# Patient Record
Sex: Male | Born: 1970 | Race: White | Hispanic: No | Marital: Married | Smoking: Former smoker
Health system: Southern US, Community
[De-identification: ages and names within clinical notes are randomized; demographics above are authoritative.]

## PROBLEM LIST (undated history)

## (undated) DIAGNOSIS — E785 Hyperlipidemia, unspecified: Secondary | ICD-10-CM

## (undated) DIAGNOSIS — I10 Essential (primary) hypertension: Secondary | ICD-10-CM

## (undated) DIAGNOSIS — T7840XA Allergy, unspecified, initial encounter: Secondary | ICD-10-CM

## (undated) HISTORY — DX: Allergy, unspecified, initial encounter: T78.40XA

## (undated) HISTORY — DX: Hyperlipidemia, unspecified: E78.5

## (undated) HISTORY — PX: CHOLECYSTECTOMY: SHX55

## (undated) HISTORY — PX: GALLBLADDER SURGERY: SHX652

## (undated) HISTORY — DX: Essential (primary) hypertension: I10

---

## 2010-01-23 ENCOUNTER — Encounter: Payer: Self-pay | Admitting: Internal Medicine

## 2010-02-18 ENCOUNTER — Ambulatory Visit: Payer: Self-pay | Admitting: Internal Medicine

## 2010-02-18 DIAGNOSIS — E785 Hyperlipidemia, unspecified: Secondary | ICD-10-CM

## 2010-03-05 ENCOUNTER — Ambulatory Visit: Payer: Self-pay | Admitting: Internal Medicine

## 2010-03-05 DIAGNOSIS — R079 Chest pain, unspecified: Secondary | ICD-10-CM

## 2010-05-21 ENCOUNTER — Ambulatory Visit: Payer: Self-pay | Admitting: Internal Medicine

## 2010-05-21 LAB — CONVERTED CEMR LAB
AST: 22 units/L (ref 0–37)
Cholesterol: 163 mg/dL (ref 0–200)

## 2010-08-24 ENCOUNTER — Ambulatory Visit: Payer: Self-pay | Admitting: Internal Medicine

## 2010-11-09 ENCOUNTER — Ambulatory Visit: Payer: Self-pay | Admitting: Internal Medicine

## 2010-11-17 LAB — CONVERTED CEMR LAB
ALT: 37 units/L (ref 0–53)
BUN: 26 mg/dL — ABNORMAL HIGH (ref 6–23)
Basophils Absolute: 0 10*3/uL (ref 0.0–0.1)
Bilirubin Urine: NEGATIVE
Bilirubin, Direct: 0.1 mg/dL (ref 0.0–0.3)
Blood in Urine, dipstick: NEGATIVE
Cholesterol: 218 mg/dL — ABNORMAL HIGH (ref 0–200)
Creatinine, Ser: 0.9 mg/dL (ref 0.4–1.5)
Direct LDL: 152.6 mg/dL
Eosinophils Relative: 1.7 % (ref 0.0–5.0)
GFR calc non Af Amer: 95.21 mL/min (ref >60.00)
Glucose, Bld: 91 mg/dL (ref 70–99)
Glucose, Urine, Semiquant: NEGATIVE
HDL: 41.9 mg/dL (ref >39.00)
Ketones, urine, test strip: NEGATIVE
Lymphs Abs: 1.7 10*3/uL (ref 0.7–4.0)
MCV: 93.1 fL (ref 78.0–100.0)
Monocytes Absolute: 0.6 10*3/uL (ref 0.1–1.0)
Monocytes Relative: 8 % (ref 3.0–12.0)
Neutrophils Relative %: 67.9 % (ref 43.0–77.0)
Platelets: 241 10*3/uL (ref 150.0–400.0)
Protein, U semiquant: NEGATIVE
RDW: 13 % (ref 11.5–14.6)
Specific Gravity, Urine: 1.02
TSH: 1.62 microintl units/mL (ref 0.35–5.50)
Total Bilirubin: 1 mg/dL (ref 0.3–1.2)
VLDL: 36.8 mg/dL (ref 0.0–40.0)
WBC: 7.7 10*3/uL (ref 4.5–10.5)
pH: 5

## 2010-12-09 ENCOUNTER — Ambulatory Visit
Admission: RE | Admit: 2010-12-09 | Discharge: 2010-12-09 | Payer: Self-pay | Source: Home / Self Care | Attending: Internal Medicine | Admitting: Internal Medicine

## 2010-12-21 NOTE — Assessment & Plan Note (Signed)
Summary: 3 month follow/pt fasting/cjr   Vital Signs:  Patient profile:   40 year old male Weight:      209 pounds Temp:     97.6 degrees F oral BP sitting:   120 / 70  (right arm) Cuff size:   regular  Vitals Entered By: Duard Brady LPN (May 21, 1609 8:00 AM) CC: 3 mos rov - doing well Is Patient Diabetic? No   CC:  3 mos rov - doing well.  History of Present Illness: 40 year old patient who is seen today for follow-up.  He has a history of dyslipidemia and was placed on Lipitor 40 mg daily 3 months ago.  He has tolerated this medication well without muscle pain or weakness.   no concerns or complaints  Preventive Screening-Counseling & Management  Alcohol-Tobacco     Smoking Status: never  Allergies (verified): No Known Drug Allergies  Past History:  Past Medical History: Reviewed history from 02/18/2010 and no changes required. Hyperlipidemia overweight   Review of Systems  The patient denies anorexia, fever, weight loss, weight gain, vision loss, decreased hearing, hoarseness, chest pain, syncope, dyspnea on exertion, peripheral edema, prolonged cough, headaches, hemoptysis, abdominal pain, melena, hematochezia, severe indigestion/heartburn, hematuria, incontinence, genital sores, muscle weakness, suspicious skin lesions, transient blindness, difficulty walking, depression, unusual weight change, abnormal bleeding, enlarged lymph nodes, angioedema, breast masses, and testicular masses.    Physical Exam  General:  overweight-appearing.  overweight-appearing.   Eyes:  No corneal or conjunctival inflammation noted. EOMI. Perrla. Funduscopic exam benign, without hemorrhages, exudates or papilledema. Vision grossly normal. Mouth:  Oral mucosa and oropharynx without lesions or exudates.  Teeth in good repair. Neck:  No deformities, masses, or tenderness noted. Lungs:  Normal respiratory effort, chest expands symmetrically. Lungs are clear to auscultation, no  crackles or wheezes. Heart:  Normal rate and regular rhythm. S1 and S2 normal without gallop, murmur, click, rub or other extra sounds. Abdomen:  Bowel sounds positive,abdomen soft and non-tender without masses, organomegaly or hernias noted.   Impression & Recommendations:  Problem # 1:  HYPERLIPIDEMIA (ICD-272.4)  His updated medication list for this problem includes:    Lipitor 40 Mg Tabs (Atorvastatin calcium) ..... One daily  Orders: Venipuncture (96045) TLB-AST (SGOT) (84450-SGOT) TLB-Lipid Panel (80061-LIPID)  His updated medication list for this problem includes:    Lipitor 40 Mg Tabs (Atorvastatin calcium) ..... One daily  Complete Medication List: 1)  Lipitor 40 Mg Tabs (Atorvastatin calcium) .... One daily  Patient Instructions: 1)  Limit your Sodium (Salt). 2)  It is important that you exercise regularly at least 20 minutes 5 times a week. If you develop chest pain, have severe difficulty breathing, or feel very tired , stop exercising immediately and seek medical attention. 3)  You need to lose weight. Consider a lower calorie diet and regular exercise.  4)  SGOT in 3 months and 6 months  and CPX in 12 months Prescriptions: LIPITOR 40 MG TABS (ATORVASTATIN CALCIUM) one daily  #90 x 6   Entered and Authorized by:   Gordy Savers  MD   Signed by:   Gordy Savers  MD on 05/21/2010   Method used:   Electronically to        [facility] # [id number]* (retail)       [address]       [place], [place]  [address]       Ph: [phone]       Fax: [phone]  RxID:   [id number]

## 2010-12-21 NOTE — Assessment & Plan Note (Signed)
Summary: joint and muscle pain/upper back/underneath rt arm and should...   Vital Signs:  Patient profile:   40 year old male Weight:      207 pounds Temp:     97.9 degrees F oral BP sitting:   122 / 84  (left arm) Cuff size:   regular  Vitals Entered By: Duard Brady LPN (March 05, 2010 4:23 PM) CC: c/o (R) flank tenderness and pain - possible injuried playing golf Is Patient Diabetic? No   CC:  c/o (R) flank tenderness and pain - possible injuried playing golf.  History of Present Illness: 40 -year-old patient who is seen today with a 3 week history of right chest wall pain.  This is aggravated by certain movements, coughing, sneezing, and aggravated by playing golf.  Denies any shortness of breath, cough, fever, or other bony complaints.  He has had no prior episodes.  He is on Lipitor for dyslipidemia, otherwise takes no chronic medications  Allergies (verified): No Known Drug Allergies  Physical Exam  General:  Well-developed,well-nourished,in no acute distress; alert,appropriate and cooperative throughout examination Chest Wall:  no tenderness along the right lateral or posterior chest wall Lungs:  chest was clear with normal aeration bilaterally.  No rub noted Heart:  normal rhythm, without tachycardia   Impression & Recommendations:  Problem # 1:  CHEST PAIN (ICD-786.50) musculoligamentous chest wall pain  Problem # 2:  HYPERLIPIDEMIA (ICD-272.4)  His updated medication list for this problem includes:    Lipitor 40 Mg Tabs (Atorvastatin calcium) ..... One daily  Complete Medication List: 1)  Lipitor 40 Mg Tabs (Atorvastatin calcium) .... One daily  Patient Instructions: 1)  Take 400-600mg  of Ibuprofen (Advil, Motrin) with food every 4-6 hours as needed for relief of pain or comfort of fever. 2)  Please schedule a follow-up appointment as needed.

## 2010-12-21 NOTE — Assessment & Plan Note (Signed)
Summary: TO BE EST/NJR   Vital Signs:  Patient profile:   40 year old male Height:      69.5 inches Weight:      210 pounds BMI:     30.68 Temp:     97.9 degrees F oral BP sitting:   118 / 70  (right arm) Cuff size:   regular  Vitals Entered By: Duard Brady LPN (February 18, 2010 2:47 PM) CC: new tp to establis - moved to area - ins lab work reflects elevated cholestorol Is Patient Diabetic? No   CC:  new tp to establis - moved to area - ins lab work reflects elevated cholestorol.  History of Present Illness: 40 year old gentleman, who is seen today to establish with our practice.  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  Concerns today  include history of dyslipidemia.  A recent lipid profile revealed a total cholesterol of 280 and an HDL cholesterol of 33 triglycerides are elevated at 600, but apparent that this was a nonfasting sample.  He denies any cardiopulmonary complaints  Preventive Screening-Counseling & Management  Alcohol-Tobacco     Smoking Status: never  Caffeine-Diet-Exercise     Does Patient Exercise: yes  Allergies (verified): No Known Drug Allergies  Past History:  Past Medical History: Hyperlipidemia overweight   Family History: Reviewed history and no changes required. father died age 39, history of coronary artery disease, status post CABG at 40; complications of lung cancer.  Also history of prostate cancer in his early 109s mother at 2 of breast cancer survivor.  History of hypertension one brother with a hypercholesterolemia  Social History: Reviewed history and no changes required. 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[employer]  [employer] 3 young children and marriedSmoking Status:  never Does Patient Exercise:  yes  Review of Systems  The patient denies anorexia, fever, weight loss, weight gain, vision loss, decreased hearing, hoarseness,  chest pain, syncope, dyspnea on exertion, peripheral edema, prolonged cough, headaches, hemoptysis, abdominal pain, melena, hematochezia, severe indigestion/heartburn, hematuria, incontinence, genital sores, muscle weakness, suspicious skin lesions, transient blindness, difficulty walking, depression, unusual weight change, abnormal bleeding, enlarged lymph nodes, angioedema, breast masses, and testicular masses.    Physical Exam  General:  overweight-appearing.  low-pressure low normal Head:  Normocephalic and atraumatic without obvious abnormalities. No apparent alopecia or balding. Eyes:  No corneal or conjunctival inflammation noted. EOMI. Perrla. Funduscopic exam benign, without hemorrhages, exudates or papilledema. Vision grossly normal. Ears:  External ear exam shows no significant lesions or deformities.  Otoscopic examination reveals clear canals, tympanic membranes are intact bilaterally without bulging, retraction, inflammation or discharge. Hearing is grossly normal bilaterally. Nose:  External nasal examination shows no deformity or inflammation. Nasal mucosa are pink and moist without lesions or exudates. Mouth:  Oral mucosa and oropharynx without lesions or exudates.  Teeth in good repair. Neck:  No deformities, masses, or tenderness noted. Chest Wall:  No deformities, masses, tenderness or gynecomastia noted. Breasts:  No masses or gynecomastia noted Lungs:  Normal respiratory effort, chest expands symmetrically. Lungs are clear to auscultation, no crackles or wheezes. Heart:  Normal rate and regular rhythm. S1 and S2 normal without gallop, murmur, click, rub or other extra sounds. Abdomen:  Bowel sounds positive,abdomen soft and non-tender without masses, organomegaly or hernias noted. Genitalia:  Testes bilaterally descended without nodularity, tenderness or masses. No scrotal masses or lesions. No penis lesions or urethral discharge. Msk:  No deformity or scoliosis noted of  thoracic or lumbar spine.   Pulses:  R and L carotid,radial,femoral,dorsalis pedis and posterior tibial pulses are full and equal bilaterally Extremities:  No clubbing, cyanosis, edema, or deformity noted with normal full range of motion of all joints.   Neurologic:  alert & oriented X3, cranial nerves II-XII intact, strength normal in all extremities, and gait normal.   Skin:  Intact without suspicious lesions or rashes Cervical Nodes:  No lymphadenopathy noted Axillary Nodes:  No palpable lymphadenopathy Inguinal Nodes:  No significant adenopathy Psych:  Cognition and judgment appear intact. Alert and cooperative with normal attention span and concentration. No apparent delusions, illusions, hallucinations   Impression & Recommendations:  Problem # 1:  Preventive Health Care (ICD-V70.0)  Problem # 2:  HYPERLIPIDEMIA (ICD-272.4)  His updated medication list for this problem includes:    Lipitor 40 Mg Tabs (Atorvastatin calcium) ..... One daily  Complete Medication List: 1)  No Current Rx Meds  2)  Lipitor 40 Mg Tabs (Atorvastatin calcium) .... One daily  Patient Instructions: 1)  Please schedule a follow-up appointment in 3 months. 2)  Advised not to eat any food or drink any liquids after 10 PM the night before your procedure. 3)  It is important that you exercise regularly at least 20 minutes 5 times a week. If you develop chest pain, have severe difficulty breathing, or feel very tired , stop exercising immediately and seek medical attention. 4)  You need to lose weight. Consider a lower calorie diet and regular exercise.  Prescriptions: LIPITOR 40 MG TABS (ATORVASTATIN CALCIUM) one daily  #90 x 6   Entered and Authorized by:   Gordy Savers  MD   Signed by:   Gordy Savers  MD on 02/18/2010   Method used:   Print then Give to Patient   RxID:   [id number]

## 2010-12-23 NOTE — Assessment & Plan Note (Signed)
Summary: CPX/CJR/PT [facility] FROM BMP/CJR   Vital Signs:  Patient profile:   40 year old male Height:      69 inches Weight:      215 pounds BMI:     31.86 Temp:     97.5 degrees F oral BP sitting:   120 / 72  (right arm) Cuff size:   regular  Vitals Entered By: Duard Brady LPN (December 09, 2010 2:49 PM) CC:  will be Is Patient Diabetic? No   CC:   will be.  History of Present Illness: 41  year old patient who is seen today for a health-maintenance examination.  He has a history of dyslipidemia, and otherwise enjoys excellent health.  No concerns or complaints today.  Unfortunately, he is not completely compliant with his atorvastatin.  lipid  profile reviewed  Preventive Screening-Counseling & Management  Alcohol-Tobacco     Smoking Status: never  Allergies (verified): No Known Drug Allergies  Past History:  Past Medical History: Reviewed history from 02/18/2010 and no changes required. Hyperlipidemia overweight   Past Surgical History: Cholecystectomy 2004- laparoscopic  Family History: Reviewed history from 02/18/2010 and no changes required. father died age 18, history of coronary artery disease, status post CABG at 32; complications of lung cancer.  Also history of prostate cancer in his early 63s mother at 57 of breast cancer survivor.  History of hypertension one brother with a hypercholesterolemia  Social History: Reviewed history from 02/18/2010 and no changes required. 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    Physical Exam  General:  overweight-appearing.  normal blood pressure  Head:  Normocephalic and atraumatic without obvious abnormalities. No apparent alopecia or balding. Eyes:  No corneal or conjunctival inflammation noted. EOMI. Perrla. Funduscopic exam benign, without hemorrhages, exudates or papilledema. Vision grossly normal. Ears:  External ear exam shows no significant lesions or deformities.  Otoscopic examination reveals clear canals, tympanic membranes are intact bilaterally without bulging, retraction, inflammation or discharge. Hearing is grossly normal bilaterally. Nose:  External nasal examination shows no deformity or inflammation. Nasal mucosa are pink and moist without lesions or exudates. Mouth:  Oral mucosa and oropharynx without lesions or exudates.  Teeth in good repair. Neck:  No deformities, masses, or tenderness noted. Chest Wall:  No deformities, masses, tenderness or gynecomastia noted. Breasts:  No masses or gynecomastia noted Lungs:  Normal respiratory effort, chest expands symmetrically. Lungs are clear to auscultation, no crackles or wheezes. Heart:  Normal rate and regular rhythm. S1 and S2 normal without gallop, murmur, click, rub or other extra sounds. Abdomen:  Bowel sounds positive,abdomen soft and non-tender without masses, organomegaly or hernias noted. Rectal:  No external abnormalities noted. Normal sphincter tone. No rectal masses or tenderness. Genitalia:  Testes bilaterally descended without nodularity, tenderness or masses. No scrotal masses or lesions. No penis lesions or urethral discharge. Prostate:  Prostate gland firm and smooth, no enlargement, nodularity, tenderness, mass, asymmetry or induration. Msk:  No deformity or scoliosis noted of  thoracic or lumbar spine.  Pulses:  R and L carotid,radial,femoral,dorsalis pedis and posterior tibial pulses are full and equal bilaterally Extremities:  No clubbing, cyanosis, edema, or deformity noted with normal full range of motion of all joints.   Neurologic:  No cranial nerve deficits noted. Station and gait are normal. Plantar reflexes are down-going bilaterally. DTRs are symmetrical throughout. Sensory, motor and coordinative functions appear intact. Skin:  Intact without suspicious lesions or rashes Cervical Nodes:  No lymphadenopathy noted Axillary Nodes:  No palpable lymphadenopathy Inguinal Nodes:  No significant adenopathy Psych:  Cognition and judgment appear intact. Alert and cooperative with normal attention span and concentration. No apparent delusions, illusions, hallucinations   Impression & Recommendations:  Problem # 1:  Preventive Health Care (ICD-V70.0)  Complete Medication List: 1)  Lipitor 40 Mg Tabs (Atorvastatin calcium) .... One daily  Patient Instructions: 1)  Please schedule a follow-up appointment in 1 year. 2)  Limit your Sodium (Salt). 3)  It is important that you exercise regularly at least 20 minutes 5 times a week. If you develop chest pain, have severe difficulty breathing, or feel very tired , stop exercising immediately and seek medical attention. 4)  You need to lose weight. Consider a lower calorie diet and regular exercise.  Prescriptions: LIPITOR 40 MG TABS (ATORVASTATIN CALCIUM) one daily  #90 x 6   Entered and Authorized by:   Gordy Savers  MD   Signed by:   Gordy Savers  MD on 12/09/2010   Method used:   Electronically to        [facility] # [phone]* (retail)       [address]       [place], [place]  [address]       Ph: [phone]       Fax: [phone]   RxID:   [id number]    Orders Added: 1)  Est. Patient 40-64 years [72536]   Immunization History:  Influenza Immunization History:    Influenza:   Historical (10/05/2010)   Immunization History:  Influenza Immunization History:    Influenza:  Historical (10/05/2010)

## 2011-01-17 ENCOUNTER — Encounter: Payer: Self-pay | Admitting: Internal Medicine

## 2011-01-17 ENCOUNTER — Ambulatory Visit (INDEPENDENT_AMBULATORY_CARE_PROVIDER_SITE_OTHER): Payer: 59 | Admitting: Internal Medicine

## 2011-01-17 VITALS — BP 122/78 | Temp 98.0°F | Wt 212.0 lb

## 2011-01-17 DIAGNOSIS — J019 Acute sinusitis, unspecified: Secondary | ICD-10-CM

## 2011-01-17 DIAGNOSIS — E785 Hyperlipidemia, unspecified: Secondary | ICD-10-CM

## 2011-01-17 MED ORDER — DOXYCYCLINE HYCLATE 100 MG PO TABS: 100.0000 mg | ORAL_TABLET | Freq: Two times a day (BID) | ORAL | Status: AC

## 2011-01-17 MED ORDER — DOXYCYCLINE HYCLATE 100 MG PO TABS: 100.0000 mg | ORAL_TABLET | Freq: Two times a day (BID) | ORAL | Status: DC

## 2011-01-17 NOTE — Patient Instructions (Signed)
Get plenty of rest, Drink lots of  clear liquids, and use Tylenol or ibuprofen for fever and discomfort.    Take your antibiotic as prescribed until ALL of it is gone, but stop if you develop a rash, swelling, or any side effects of the medication.  Contact our office as soon as possible if  there are side effects of the medication.  Call or return to clinic prn if these symptoms worsen or fail to improve as anticipated.   

## 2011-01-17 NOTE — Progress Notes (Signed)
  Subjective:    Patient ID: Charles Rowe, male    DOB: 08-Oct-1971, 40 y.o.   MRN: [id number]  HPI  40 year old patient who presents with a three-week history of sinus congestion. He initially had a URI and symptoms improved but for the past week has had worsening sinus pain pressure and worsening purulent sinus drainage. He describes mostly pain in the frontal sinus area there's been no documented fever but he has felt fatigued and unwell   Review of Systems  Constitutional: Negative for fever, chills, appetite change and fatigue.  HENT: Positive for congestion and rhinorrhea. Negative for hearing loss, ear pain, sore throat, trouble swallowing, neck stiffness, dental problem, voice change and tinnitus.   Eyes: Negative for pain, discharge and visual disturbance.  Respiratory: Negative for cough, chest tightness, wheezing and stridor.   Cardiovascular: Negative for chest pain, palpitations and leg swelling.  Gastrointestinal: Negative for nausea, vomiting, abdominal pain, diarrhea, constipation, blood in stool and abdominal distention.  Genitourinary: Negative for urgency, hematuria, flank pain, discharge, difficulty urinating and genital sores.  Musculoskeletal: Negative for myalgias, back pain, joint swelling, arthralgias and gait problem.  Skin: Negative for rash.  Neurological: Negative for dizziness, syncope, speech difficulty, weakness, numbness and headaches.  Hematological: Negative for adenopathy. Does not bruise/bleed easily.  Psychiatric/Behavioral: Negative for behavioral problems and dysphoric mood. The patient is not nervous/anxious.        Objective:   Physical Exam  Constitutional: He is oriented to person, place, and time. He appears well-developed and well-nourished.  HENT:  Head: Normocephalic.  Right Ear: External ear normal.  Left Ear: External ear normal.  Eyes: Conjunctivae and EOM are normal.  Neck: Normal range of motion.  Cardiovascular: Normal rate and  normal heart sounds.   Pulmonary/Chest: Breath sounds normal.  Abdominal: Bowel sounds are normal.  Musculoskeletal: Normal range of motion. He exhibits no edema and no tenderness.  Neurological: He is alert and oriented to person, place, and time.  Psychiatric: He has a normal mood and affect. His behavior is normal.          Assessment & Plan:   acute sinusitis-   We'll continue expectorants and add nasal saline irrigation. We'll treat with 10 days of doxycycline

## 2011-06-01 ENCOUNTER — Encounter: Payer: Self-pay | Admitting: Family Medicine

## 2011-06-01 ENCOUNTER — Ambulatory Visit (INDEPENDENT_AMBULATORY_CARE_PROVIDER_SITE_OTHER): Payer: 59 | Admitting: Family Medicine

## 2011-06-01 DIAGNOSIS — R319 Hematuria, unspecified: Secondary | ICD-10-CM

## 2011-06-01 DIAGNOSIS — M549 Dorsalgia, unspecified: Secondary | ICD-10-CM

## 2011-06-01 LAB — POCT URINALYSIS DIPSTICK
Bilirubin, UA: NEGATIVE
Glucose, UA: NEGATIVE
Leukocytes, UA: NEGATIVE
Nitrite, UA: NEGATIVE
Urobilinogen, UA: 0.2

## 2011-06-01 NOTE — Progress Notes (Signed)
  Subjective:    Patient ID: Charles Rowe, male    DOB: October 29, 1971, 40 y.o.   MRN: [id number]  HPI Patient seen with initial question of bladder infection. Yesterday had dull achy pain left lower lumbar. Some radiation to groin. Remote history of kidney stone about 10 years ago. This pain somewhat different. Mild to moderate severity. Pain resolved today. Chills last night but no documented fever. Yesterday had some burning with urination frequency but none today. No gross hematuria. No abdominal pain. No aspirin or anticoagulant use. Nonsmoker   Review of Systems  Constitutional: Negative for fever and chills.  Respiratory: Negative for cough and shortness of breath.   Cardiovascular: Negative for chest pain, palpitations and leg swelling.  Gastrointestinal: Negative for abdominal pain and abdominal distention.  Musculoskeletal: Positive for back pain.       Objective:   Physical Exam  Constitutional: He appears well-developed and well-nourished.  Cardiovascular: Normal rate, regular rhythm and normal heart sounds.   Pulmonary/Chest: Effort normal and breath sounds normal. No respiratory distress. He has no wheezes. He has no rales.  Abdominal: Soft. He exhibits no mass. There is no tenderness. There is no rebound and no guarding.  Musculoskeletal:       No flank tenderness          Assessment & Plan:  #1 low back pain resolved today. Questionable etiology. Clinically this does not sound much like kidney stone #2 hematuria on urine dipstick. No leukocytes or other suggestion of infection. Recommend followup UA in 2 weeks and if hematuria persists that time urology referral

## 2011-06-16 ENCOUNTER — Other Ambulatory Visit: Payer: 59

## 2011-06-16 DIAGNOSIS — R319 Hematuria, unspecified: Secondary | ICD-10-CM

## 2011-06-16 LAB — POCT URINALYSIS DIPSTICK
Blood, UA: NEGATIVE
Nitrite, UA: NEGATIVE
Protein, UA: NEGATIVE
Spec Grav, UA: 1.02
Urobilinogen, UA: 0.2

## 2011-12-11 ENCOUNTER — Other Ambulatory Visit: Payer: Self-pay | Admitting: Internal Medicine

## 2012-10-10 ENCOUNTER — Encounter: Payer: Self-pay | Admitting: Internal Medicine

## 2012-10-10 ENCOUNTER — Ambulatory Visit (INDEPENDENT_AMBULATORY_CARE_PROVIDER_SITE_OTHER): Payer: 59 | Admitting: Internal Medicine

## 2012-10-10 VITALS — BP 140/90 | HR 80 | Temp 97.8°F | Resp 18 | Wt 211.0 lb

## 2012-10-10 DIAGNOSIS — Z0000 Encounter for general adult medical examination without abnormal findings: Secondary | ICD-10-CM

## 2012-10-10 DIAGNOSIS — E785 Hyperlipidemia, unspecified: Secondary | ICD-10-CM

## 2012-10-10 LAB — COMPREHENSIVE METABOLIC PANEL
CO2: 29 mEq/L (ref 19–32)
Creatinine, Ser: 0.9 mg/dL (ref 0.4–1.5)
GFR: 96.74 mL/min (ref >60.00)
Glucose, Bld: 108 mg/dL — ABNORMAL HIGH (ref 70–99)
Total Bilirubin: 0.7 mg/dL (ref 0.3–1.2)

## 2012-10-10 LAB — CBC WITH DIFFERENTIAL/PLATELET
Basophils Relative: 0.5 % (ref 0.0–3.0)
Eosinophils Relative: 1.3 % (ref 0.0–5.0)
HCT: 45.5 % (ref 39.0–52.0)
Hemoglobin: 15.3 g/dL (ref 13.0–17.0)
Lymphs Abs: 1.5 10*3/uL (ref 0.7–4.0)
Monocytes Relative: 7.1 % (ref 3.0–12.0)
Neutro Abs: 5.9 10*3/uL (ref 1.4–7.7)
WBC: 8.1 10*3/uL (ref 4.5–10.5)

## 2012-10-10 LAB — TSH: TSH: 1.38 u[IU]/mL (ref 0.35–5.50)

## 2012-10-10 MED ORDER — ATORVASTATIN CALCIUM 40 MG PO TABS: 40.0000 mg | ORAL_TABLET | Freq: Every day | ORAL | Status: DC

## 2012-10-10 NOTE — Progress Notes (Signed)
  Subjective:    Patient ID: Charles Rowe, male    DOB: 12/05/1970, 41 y.o.   MRN: [id number]  HPI  41 year old patient who has a history of dyslipidemia. He is doing quite well and is seen for his annual evaluation. He has no concerns or complaints. Has tolerated medication well but has been out for a few weeks.  No past medical history on file.  History   Social History  . Marital Status: Married    Spouse Name: N/A    Number of Children: N/A  . Years of Education: N/A   Occupational History  . Not on file.   Social History Main Topics  . Smoking status: Current Some Day Smoker    Types: Cigarettes, Cigars  . Smokeless tobacco: Former Neurosurgeon     Comment: 10 cigarettes a week  . Alcohol Use: Not on file  . Drug Use: Not on file  . Sexually Active: Not on file   Other Topics Concern  . Not on file   Social History Narrative  . No narrative on file    No past surgical history on file.  No family history on file.  No Known Allergies  Current Outpatient Prescriptions on File Prior to Visit  Medication Sig Dispense Refill  . [DISCONTINUED] atorvastatin (LIPITOR) 40 MG tablet TAKE 1 TABLET BY MOUTH EVERY DAY  90 tablet  1    BP 140/90  Pulse 80  Temp 97.8 F (36.6 C) (Oral)  Resp 18  Wt 211 lb (95.709 kg)     Review of Systems  Constitutional: Negative for fever, chills, appetite change and fatigue.  HENT: Negative for hearing loss, ear pain, congestion, sore throat, trouble swallowing, neck stiffness, dental problem, voice change and tinnitus.   Eyes: Negative for pain, discharge and visual disturbance.  Respiratory: Negative for cough, chest tightness, wheezing and stridor.   Cardiovascular: Negative for chest pain, palpitations and leg swelling.  Gastrointestinal: Negative for nausea, vomiting, abdominal pain, diarrhea, constipation, blood in stool and abdominal distention.  Genitourinary: Negative for urgency, hematuria, flank pain, discharge, difficulty  urinating and genital sores.  Musculoskeletal: Negative for myalgias, back pain, joint swelling, arthralgias and gait problem.  Skin: Negative for rash.  Neurological: Negative for dizziness, syncope, speech difficulty, weakness, numbness and headaches.  Hematological: Negative for adenopathy. Does not bruise/bleed easily.  Psychiatric/Behavioral: Negative for behavioral problems and dysphoric mood. The patient is not nervous/anxious.        Objective:   Physical Exam  Constitutional: He is oriented to person, place, and time. He appears well-developed.  HENT:  Head: Normocephalic.  Right Ear: External ear normal.  Left Ear: External ear normal.  Eyes: Conjunctivae normal and EOM are normal.  Neck: Normal range of motion.  Cardiovascular: Normal rate and normal heart sounds.   Pulmonary/Chest: Breath sounds normal.  Abdominal: Bowel sounds are normal.  Musculoskeletal: Normal range of motion. He exhibits no edema and no tenderness.  Neurological: He is alert and oriented to person, place, and time.  Psychiatric: He has a normal mood and affect. His behavior is normal.          Assessment & Plan:   HLD-   Lipitor RF CPX 1 year

## 2012-10-10 NOTE — Patient Instructions (Signed)
Limit your sodium (Salt) intake    It is important that you exercise regularly, at least 20 minutes 3 to 4 times per week.  If you develop chest pain or shortness of breath seek  medical attention.  Return in one year for follow-up   

## 2013-10-03 ENCOUNTER — Other Ambulatory Visit (INDEPENDENT_AMBULATORY_CARE_PROVIDER_SITE_OTHER): Payer: 59

## 2013-10-03 DIAGNOSIS — Z0000 Encounter for general adult medical examination without abnormal findings: Secondary | ICD-10-CM

## 2013-10-03 LAB — LIPID PANEL
Cholesterol: 206 mg/dL — ABNORMAL HIGH (ref 0–200)
HDL: 37.7 mg/dL — ABNORMAL LOW (ref >39.00)
Total CHOL/HDL Ratio: 5

## 2013-10-03 LAB — POCT URINALYSIS DIPSTICK
Leukocytes, UA: NEGATIVE
Nitrite, UA: NEGATIVE
Protein, UA: NEGATIVE
Urobilinogen, UA: 0.2

## 2013-10-03 LAB — BASIC METABOLIC PANEL
CO2: 28 mEq/L (ref 19–32)
Calcium: 9.2 mg/dL (ref 8.4–10.5)
Creatinine, Ser: 1 mg/dL (ref 0.4–1.5)
Glucose, Bld: 109 mg/dL — ABNORMAL HIGH (ref 70–99)
Sodium: 139 mEq/L (ref 135–145)

## 2013-10-03 LAB — CBC WITH DIFFERENTIAL/PLATELET
Basophils Absolute: 0 10*3/uL (ref 0.0–0.1)
Eosinophils Absolute: 0.1 10*3/uL (ref 0.0–0.7)
Hemoglobin: 15.2 g/dL (ref 13.0–17.0)
Lymphocytes Relative: 21.3 % (ref 12.0–46.0)
MCHC: 34.5 g/dL (ref 30.0–36.0)
Neutro Abs: 5 10*3/uL (ref 1.4–7.7)
RDW: 12.9 % (ref 11.5–14.6)

## 2013-10-03 LAB — HEPATIC FUNCTION PANEL
Albumin: 4.3 g/dL (ref 3.5–5.2)
Alkaline Phosphatase: 48 U/L (ref 39–117)

## 2013-10-03 LAB — TSH: TSH: 0.29 u[IU]/mL — ABNORMAL LOW (ref 0.35–5.50)

## 2013-10-10 ENCOUNTER — Other Ambulatory Visit: Payer: Self-pay | Admitting: Internal Medicine

## 2013-10-10 ENCOUNTER — Ambulatory Visit (INDEPENDENT_AMBULATORY_CARE_PROVIDER_SITE_OTHER): Payer: 59 | Admitting: Internal Medicine

## 2013-10-10 ENCOUNTER — Encounter: Payer: Self-pay | Admitting: Internal Medicine

## 2013-10-10 VITALS — BP 150/98 | HR 78 | Resp 20 | Ht 69.0 in | Wt 214.0 lb

## 2013-10-10 DIAGNOSIS — Z23 Encounter for immunization: Secondary | ICD-10-CM

## 2013-10-10 DIAGNOSIS — E785 Hyperlipidemia, unspecified: Secondary | ICD-10-CM

## 2013-10-10 DIAGNOSIS — I10 Essential (primary) hypertension: Secondary | ICD-10-CM

## 2013-10-10 DIAGNOSIS — Z0000 Encounter for general adult medical examination without abnormal findings: Secondary | ICD-10-CM

## 2013-10-10 MED ORDER — LISINOPRIL-HYDROCHLOROTHIAZIDE 20-12.5 MG PO TABS: 1.0000 | ORAL_TABLET | Freq: Every day | ORAL | Status: DC

## 2013-10-10 NOTE — Progress Notes (Signed)
Subjective:    Patient ID: Charles Rowe, male    DOB: [birth year], 42 y.o.   MRN: [id number]  HPI Pre-visit discussion using our clinic review tool. No additional management support is needed unless otherwise documented below in the visit note.  42 year old patient who is seen today for a health maintenance exam. He has treated dyslipidemia. He is compliant with his atorvastatin about 75% of the time. He went to his dentist in May and blood pressure slightly elevated. He has a family history of hypertension and has been monitoring blood pressure readings that are consistently mildly elevated. In general does quite well.   Alcohol-Tobacco  Smoking Status: never  Caffeine-Diet-Exercise  Does Patient Exercise: yes   Allergies (verified):  No Known Drug Allergies   Past History:  Past Medical History:  Hyperlipidemia  overweight   Family History:   father died age 66, history of coronary artery disease, status post CABG at 79; complications of lung cancer. Also history of prostate cancer in his early 69s  mother at 58 of breast cancer survivor. 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  HENT: Negative for congestion, dental problem, ear pain, hearing loss, mouth sores, rhinorrhea, sinus pressure, sneezing, tinnitus, trouble swallowing and voice change.   Eyes: Negative for photophobia, pain, redness and visual disturbance.  Respiratory: Negative for apnea, cough, choking, chest tightness, shortness of breath and wheezing.   Cardiovascular: Negative for chest pain, palpitations and leg swelling.  Gastrointestinal:  Negative for nausea, vomiting, abdominal pain, diarrhea, constipation, blood in stool, abdominal distention, anal bleeding and rectal pain.  Genitourinary: Negative for dysuria, urgency, frequency, hematuria, flank pain, decreased urine volume, discharge, penile swelling, scrotal swelling, difficulty urinating, genital sores and testicular pain.  Musculoskeletal: Negative for arthralgias, back pain, gait problem, joint swelling, myalgias, neck pain and neck stiffness.  Skin: Negative for color change, rash and wound.  Neurological: Negative for dizziness, tremors, seizures, syncope, facial asymmetry, speech difficulty, weakness, light-headedness, numbness and headaches.  Hematological: Negative for adenopathy. Does not bruise/bleed easily.  Psychiatric/Behavioral: Negative for suicidal ideas, hallucinations, behavioral problems, confusion, sleep disturbance, self-injury, dysphoric mood, decreased concentration and agitation. The patient is not nervous/anxious.        Objective:   Physical Exam  Constitutional: He appears well-developed and well-nourished.  HENT:  Head: Normocephalic and atraumatic.  Right Ear: External ear normal.  Left Ear: External ear normal.  Nose: Nose normal.  Mouth/Throat: Oropharynx is clear and moist.  Eyes: Conjunctivae and EOM are normal. Pupils are equal, round, and reactive to light. No scleral icterus.  Neck: Normal range of motion. Neck supple. No JVD present. No thyromegaly present.  Cardiovascular: Regular rhythm, normal heart sounds and intact distal pulses.  Exam reveals no gallop and no friction rub.   No murmur heard. Pulmonary/Chest: Effort normal and breath sounds normal. He exhibits no tenderness.  Abdominal: Soft. Bowel sounds are normal. He exhibits no distension and no mass. There is no tenderness.  Genitourinary: Penis normal.  Musculoskeletal: Normal range of motion. He exhibits no edema and no tenderness.  Lymphadenopathy:    He has no  cervical adenopathy.  Neurological: He is alert. He has normal  reflexes. No cranial nerve deficit. Coordination normal.  Skin: Skin is warm and dry. No rash noted.  Psychiatric: He has a normal mood and affect. His behavior is normal.          Assessment & Plan:   Preventive health exam Dyslipidemia Hypertension. Patient has been monitoring blood pressure readings for months and readings have been consistently mildly elevated we'll place on lisinopril. Home blood pressure monitor and encourage as well as a low salt diet

## 2013-10-10 NOTE — Patient Instructions (Addendum)
Limit your sodium (Salt) intake  Please check your blood pressure on a regular basis.  If it is consistently greater than 150/90, please make an office appointment.    It is important that you exercise regularly, at least 20 minutes 3 to 4 times per week.  If you develop chest pain or shortness of breath seek  medical attention.  You need to lose weight.  Consider a lower calorie diet and regular exercise.DASH Diet The DASH diet stands for "Dietary Approaches to Stop Hypertension." It is a healthy eating plan that has been shown to reduce high blood pressure (hypertension) in as little as 14 days, while also possibly providing other significant health benefits. These other health benefits include reducing the risk of breast cancer after menopause and reducing the risk of type 2 diabetes, heart disease, colon cancer, and stroke. Health benefits also include weight loss and slowing kidney failure in patients with chronic kidney disease.  DIET GUIDELINES  Limit salt (sodium). Your diet should contain less than 1500 mg of sodium daily.  Limit refined or processed carbohydrates. Your diet should include mostly whole grains. Desserts and added sugars should be used sparingly.  Include small amounts of heart-healthy fats. These types of fats include nuts, oils, and tub margarine. Limit saturated and trans fats. These fats have been shown to be harmful in the body. CHOOSING FOODS  The following food groups are based on a 2000 calorie diet. See your Registered Dietitian for individual calorie needs. Grains and Grain Products (6 to 8 servings daily)  Eat More Often: Whole-wheat bread, brown rice, whole-grain or wheat pasta, quinoa, popcorn without added fat or salt (air popped).  Eat Less Often: White bread, white pasta, white rice, cornbread. Vegetables (4 to 5 servings daily)  Eat More Often: Fresh, frozen, and canned vegetables. Vegetables may be raw, steamed, roasted, or grilled with a minimal  amount of fat.  Eat Less Often/Avoid: Creamed or fried vegetables. Vegetables in a cheese sauce. Fruit (4 to 5 servings daily)  Eat More Often: All fresh, canned (in natural juice), or frozen fruits. Dried fruits without added sugar. One hundred percent fruit juice ( cup [237 mL] daily).  Eat Less Often: Dried fruits with added sugar. Canned fruit in light or heavy syrup. [employer], Fish, and Poultry (2 servings or less daily. One serving is 3 to 4 oz [85-114 g]).  Eat More Often: Ninety percent or leaner ground beef, tenderloin, sirloin. Round cuts of beef, chicken breast, [place] breast. All fish. Grill, bake, or broil your meat. Nothing should be fried.  Eat Less Often/Avoid: Fatty cuts of meat, [place], or chicken leg, thigh, or wing. Fried cuts of meat or fish. Dairy (2 to 3 servings)  Eat More Often: Low-fat or fat-free milk, low-fat plain or light yogurt, reduced-fat or part-skim cheese.  Eat Less Often/Avoid: Milk (whole, 2%).Whole milk yogurt. Full-fat cheeses. Nuts, Seeds, and Legumes (4 to 5 servings per week)  Eat More Often: All without added salt.  Eat Less Often/Avoid: Salted nuts and seeds, canned beans with added salt. Fats and Sweets (limited)  Eat More Often: Vegetable oils, tub margarines without trans fats, sugar-free gelatin. Mayonnaise and salad dressings.  Eat Less Often/Avoid: Coconut oils, palm oils, butter, stick margarine, cream, half and half, cookies, candy, pie. FOR MORE INFORMATION The Dash Diet Eating Plan: www.dashdiet.org Document Released: 10/27/2011 Document Revised: 01/30/2012 Document Reviewed: 10/27/2011 [facility] Patient Information 2014 [place], [place]. Hypertension As your heart beats, it forces blood through your arteries. This  force is your blood pressure. If the pressure is too high, it is called hypertension (HTN) or high blood pressure. HTN is dangerous because you may have it and not know it. High blood pressure may mean that your  heart has to work harder to pump blood. Your arteries may be narrow or stiff. The extra work puts you at risk for heart disease, stroke, and other problems.  Blood pressure consists of two numbers, a higher number over a lower, 110/72, for example. It is stated as "110 over 72." The ideal is below 120 for the top number (systolic) and under 80 for the bottom (diastolic). Write down your blood pressure today. You should pay close attention to your blood pressure if you have certain conditions such as:  Heart failure.  Prior heart attack.  Diabetes  Chronic kidney disease.  Prior stroke.  Multiple risk factors for heart disease. To see if you have HTN, your blood pressure should be measured while you are seated with your arm held at the level of the heart. It should be measured at least twice. A one-time elevated blood pressure reading (especially in the Emergency Department) does not mean that you need treatment. There may be conditions in which the blood pressure is different between your right and left arms. It is important to see your caregiver soon for a recheck. Most people have essential hypertension which means that there is not a specific cause. This type of high blood pressure may be lowered by changing lifestyle factors such as:  Stress.  Smoking.  Lack of exercise.  Excessive weight.  Drug/tobacco/alcohol use.  Eating less salt. Most people do not have symptoms from high blood pressure until it has caused damage to the body. Effective treatment can often prevent, delay or reduce that damage. TREATMENT  When a cause has been identified, treatment for high blood pressure is directed at the cause. There are a large number of medications to treat HTN. These fall into several categories, and your caregiver will help you select the medicines that are best for you. Medications may have side effects. You should review side effects with your caregiver. If your blood pressure stays  high after you have made lifestyle changes or started on medicines,   Your medication(s) may need to be changed.  Other problems may need to be addressed.  Be certain you understand your prescriptions, and know how and when to take your medicine.  Be sure to follow up with your caregiver within the time frame advised (usually within two weeks) to have your blood pressure rechecked and to review your medications.  If you are taking more than one medicine to lower your blood pressure, make sure you know how and at what times they should be taken. Taking two medicines at the same time can result in blood pressure that is too low. SEEK IMMEDIATE MEDICAL CARE IF:  You develop a severe headache, blurred or changing vision, or confusion.  You have unusual weakness or numbness, or a faint feeling.  You have severe chest or abdominal pain, vomiting, or breathing problems. MAKE SURE YOU:   Understand these instructions.  Will watch your condition.  Will get help right away if you are not doing well or get worse. Document Released: 11/07/2005 Document Revised: 01/30/2012 Document Reviewed: 06/27/2008 [facility] Patient Information 2014 [place], [place].

## 2014-01-21 ENCOUNTER — Telehealth: Payer: Self-pay | Admitting: Internal Medicine

## 2014-01-21 ENCOUNTER — Ambulatory Visit (INDEPENDENT_AMBULATORY_CARE_PROVIDER_SITE_OTHER): Payer: 59 | Admitting: Family

## 2014-01-21 ENCOUNTER — Encounter: Payer: Self-pay | Admitting: Family

## 2014-01-21 VITALS — BP 120/80 | HR 102 | Temp 97.8°F | Wt 217.0 lb

## 2014-01-21 DIAGNOSIS — T7840XA Allergy, unspecified, initial encounter: Secondary | ICD-10-CM

## 2014-01-21 DIAGNOSIS — K122 Cellulitis and abscess of mouth: Secondary | ICD-10-CM

## 2014-01-21 MED ORDER — METHYLPREDNISOLONE ACETATE 40 MG/ML IJ SUSP
80.0000 mg | Freq: Once | INTRAMUSCULAR | Status: AC
  Administered 2014-01-21: 80 mg via INTRAMUSCULAR

## 2014-01-21 NOTE — Telephone Encounter (Signed)
Relevant patient education mailed to patient.  

## 2014-01-21 NOTE — Progress Notes (Signed)
Pre visit review using our clinic review tool, if applicable. No additional management support is needed unless otherwise documented below in the visit note. 

## 2014-01-21 NOTE — Progress Notes (Signed)
   Subjective:    Patient ID: Charles Rowe, male    DOB: [birth year], 43 y.o.   MRN: [id number]  HPI 43 year old white male, nonsmoker, patient of Dr. Kirtland BouchardK is in today with complaints of a swollen uvula that he discovered this morning when he woke. Denies any sneezing, cough, or congestion. Admits to sore throat. Reports eating crab legs for dinner last night. No known shellfish allergy in the past.   Review of Systems  Constitutional: Negative.   HENT: Positive for sore throat. Negative for postnasal drip, rhinorrhea, sinus pressure and sneezing.        Uvula swollen  Respiratory: Negative.   Cardiovascular: Negative.   Musculoskeletal: Negative for arthralgias.  Skin: Negative.   Allergic/Immunologic: Negative.  Negative for environmental allergies and food allergies.  Psychiatric/Behavioral: Negative.        Objective:   Physical Exam  Constitutional: He is oriented to person, place, and time. He appears well-developed and well-nourished.  HENT:  Right Ear: External ear normal.  Left Ear: External ear normal.  Nose: Nose normal.  Mouth/Throat: No oropharyngeal exudate.  Uvula mildly edematous.   Neck: Normal range of motion. Neck supple.  Cardiovascular: Normal rate, regular rhythm and normal heart sounds.   Pulmonary/Chest: Effort normal and breath sounds normal.  Neurological: He is alert and oriented to person, place, and time.  Skin: Skin is warm and dry.          Assessment & Plan:

## 2014-01-21 NOTE — Patient Instructions (Signed)
Uvulitis °Uvulitis is redness and soreness (inflammation) of the uvula. The uvula is the small tongue-shaped piece of tissue in the back of your mouth.  °CAUSES °Infection is a common cause of uvulitis. Infection of the uvula can be either viral or bacterial. Infectious uvulitis usually only occurs in association with another condition, such as inflammation and infection of the mouth or throat.  °Other causes of uvulitis include: °· Trauma to the uvula. °· Swelling from excess fluid buildup (edema), which may be an allergic reaction. °· Inhalation of irritants, such as chemical agents, smoke, or steam. °DIAGNOSIS °Your caregiver can usually diagnose uvulitis through a physical examination. Bacterial uvulitis can be diagnosed through the results of the growth of samples of bodily substances taken from your mouth (cultures). °HOME CARE INSTRUCTIONS  °· Rest as much as possible. °· Young children may suck on frozen juice bars or frozen ice pops. Older children and adults may gargle with a warm or cold liquid to help soothe the throat. (Mix ¼ tsp of salt in 8 oz of water, or use strong tea.) °· Use a cool-mist humidifier to lessen throat irritation and cough. °· Drink enough fluids to keep your urine clear or pale yellow. °· While the throat is very sore, eat soft or liquid foods such as milk, ice cream, soups, or milk drinks. °· Family members who develop a sore throat or fever should have a medical exam or throat culture. °· If your child has uvulitis and is taking antibiotic medicine, wait 24 hours or until his or her temperature is near normal (less than 100° F [37.8° C]) before allowing him or her to return to school or day care. °· Only take over-the-counter or prescription medicines for pain, discomfort, or fever as directed by your caregiver. °Ask when your test results will be ready. Make sure you get your test results.  °SEEK MEDICAL CARE IF:  °· You have an oral temperature above 102° F (38.9° C). °· You  develop large, tender lumps your the neck. °· Your child develops a rash. °· You cough up green, yellow-brown, or bloody substances. °SEEK IMMEDIATE MEDICAL CARE IF:  °· You develop any new symptoms, such as vomiting, earache, severe headache, stiff neck, chest pain, or trouble breathing or swallowing. °· Your airway is blocked. °· You develop more severe throat pain along with drooling or voice changes. °Document Released: 06/17/2004 Document Revised: 01/30/2012 Document Reviewed: 01/13/2011 °[employer]® Patient Information ©2014 [employer]. ° °

## 2014-02-28 ENCOUNTER — Ambulatory Visit (INDEPENDENT_AMBULATORY_CARE_PROVIDER_SITE_OTHER): Payer: 59 | Admitting: Internal Medicine

## 2014-02-28 ENCOUNTER — Encounter (INDEPENDENT_AMBULATORY_CARE_PROVIDER_SITE_OTHER): Payer: Self-pay | Admitting: General Surgery

## 2014-02-28 ENCOUNTER — Encounter: Payer: Self-pay | Admitting: Internal Medicine

## 2014-02-28 ENCOUNTER — Ambulatory Visit (INDEPENDENT_AMBULATORY_CARE_PROVIDER_SITE_OTHER): Payer: 59 | Admitting: General Surgery

## 2014-02-28 VITALS — BP 120/76 | Temp 98.2°F | Ht 69.0 in | Wt 210.0 lb

## 2014-02-28 VITALS — BP 124/80 | HR 76 | Temp 98.3°F | Resp 16 | Ht 69.0 in | Wt 209.8 lb

## 2014-02-28 DIAGNOSIS — IMO0002 Reserved for concepts with insufficient information to code with codable children: Secondary | ICD-10-CM

## 2014-02-28 DIAGNOSIS — L089 Local infection of the skin and subcutaneous tissue, unspecified: Secondary | ICD-10-CM

## 2014-02-28 DIAGNOSIS — L02411 Cutaneous abscess of right axilla: Secondary | ICD-10-CM

## 2014-02-28 DIAGNOSIS — L723 Sebaceous cyst: Secondary | ICD-10-CM

## 2014-02-28 MED ORDER — DOXYCYCLINE HYCLATE 100 MG PO TABS: 100.0000 mg | ORAL_TABLET | Freq: Two times a day (BID) | ORAL | Status: DC

## 2014-02-28 MED ORDER — OXYCODONE HCL 5 MG PO TABS
5.0000 mg | ORAL_TABLET | Freq: Four times a day (QID) | ORAL | Status: DC | PRN
Start: 2014-02-28 — End: 2014-04-03

## 2014-02-28 NOTE — Patient Instructions (Signed)
Abscess An abscess is an infected area that contains a collection of pus and debris.It can occur in almost any part of the body. An abscess is also known as a furuncle or boil. CAUSES  An abscess occurs when tissue gets infected. This can occur from blockage of oil or sweat glands, infection of hair follicles, or a minor injury to the skin. As the body tries to fight the infection, pus collects in the area and creates pressure under the skin. This pressure causes pain. People with weakened immune systems have difficulty fighting infections and get certain abscesses more often.  SYMPTOMS Usually an abscess develops on the skin and becomes a painful mass that is red, warm, and tender. If the abscess forms under the skin, you may feel a moveable soft area under the skin. Some abscesses break open (rupture) on their own, but most will continue to get worse without care. The infection can spread deeper into the body and eventually into the bloodstream, causing you to feel ill.  DIAGNOSIS  Your caregiver will take your medical history and perform a physical exam. A sample of fluid may also be taken from the abscess to determine what is causing your infection. TREATMENT  Your caregiver may prescribe antibiotic medicines to fight the infection. However, taking antibiotics alone usually does not cure an abscess. Your caregiver may need to make a small cut (incision) in the abscess to drain the pus. In some cases, gauze is packed into the abscess to reduce pain and to continue draining the area. HOME CARE INSTRUCTIONS   Only take over-the-counter or prescription medicines for pain, discomfort, or fever as directed by your caregiver.  If you were prescribed antibiotics, take them as directed. Finish them even if you start to feel better.  If gauze is used, follow your caregiver's directions for changing the gauze.  To avoid spreading the infection:  Keep your draining abscess covered with a  bandage.  Wash your hands well.  Do not share personal care items, towels, or whirlpools with others.  Avoid skin contact with others.  Keep your skin and clothes clean around the abscess.  Keep all follow-up appointments as directed by your caregiver. SEEK MEDICAL CARE IF:   You have increased pain, swelling, redness, fluid drainage, or bleeding.  You have muscle aches, chills, or a general ill feeling.  You have a fever. MAKE SURE YOU:   Understand these instructions.  Will watch your condition.  Will get help right away if you are not doing well or get worse. Document Released: 08/17/2005 Document Revised: 05/08/2012 Document Reviewed: 01/20/2012 [employer] Patient Information 2014 [employer].  

## 2014-02-28 NOTE — Patient Instructions (Signed)
Epidermal Cyst An epidermal cyst is sometimes called a sebaceous cyst, epidermal inclusion cyst, or infundibular cyst. These cysts usually contain a substance that looks "pasty" or "cheesy" and may have a bad smell. This substance is a protein called keratin. Epidermal cysts are usually found on the face, neck, or trunk. They may also occur in the vaginal area or other parts of the genitalia of both men and women. Epidermal cysts are usually small, painless, slow-growing bumps or lumps that move freely under the skin. It is important not to try to pop them. This may cause an infection and lead to tenderness and swelling. CAUSES  Epidermal cysts may be caused by a deep penetrating injury to the skin or a plugged hair follicle, often associated with acne. SYMPTOMS  Epidermal cysts can become inflamed and cause:  Redness.  Tenderness.  Increased temperature of the skin over the bumps or lumps.  Grayish-white, bad smelling material that drains from the bump or lump. DIAGNOSIS  Epidermal cysts are easily diagnosed by your caregiver during an exam. Rarely, a tissue sample (biopsy) may be taken to rule out other conditions that may resemble epidermal cysts. TREATMENT   Epidermal cysts often get better and disappear on their own. They are rarely ever cancerous.  If a cyst becomes infected, it may become inflamed and tender. This may require opening and draining the cyst. Treatment with antibiotics may be necessary. When the infection is gone, the cyst may be removed with minor surgery.  Small, inflamed cysts can often be treated with antibiotics or by injecting steroid medicines.  Sometimes, epidermal cysts become large and bothersome. If this happens, surgical removal in your caregiver's office may be necessary. HOME CARE INSTRUCTIONS  Only take over-the-counter or prescription medicines as directed by your caregiver.  Take your antibiotics as directed. Finish them even if you start to feel  better. SEEK MEDICAL CARE IF:   Your cyst becomes tender, red, or swollen.  Your condition is not improving or is getting worse.  You have any other questions or concerns. MAKE SURE YOU:  Understand these instructions.  Will watch your condition.  Will get help right away if you are not doing well or get worse. Document Released: 10/08/2004 Document Revised: 01/30/2012 Document Reviewed: 05/16/2011 [facility] Patient Information 2014 [place], [place].  Abscess Care After An abscess (also called a boil or furuncle) is an infected area that contains a collection of pus. Signs and symptoms of an abscess include pain, tenderness, redness, or hardness, or you may feel a moveable soft area under your skin. An abscess can occur anywhere in the body. The infection may spread to surrounding tissues causing cellulitis. A cut (incision) by the surgeon was made over your abscess and the pus was drained out. Gauze may have been packed into the space to provide a drain that will allow the cavity to heal from the inside outwards. The boil may be painful for 5 to 7 days. Most people with a boil do not have high fevers. Your abscess, if seen early, may not have localized, and may not have been lanced. If not, another appointment may be required for this if it does not get better on its own or with medications.  HOME CARE INSTRUCTIONS   Only take over-the-counter or prescription medicines for pain, discomfort, or fever as directed by your caregiver.   When you bathe, soak and then remove gauze and iodoform packs. You may then wash the wound gently with mild soapy water. Repack the  wound with packing strip (enough to keep skin edges separated) and cover with gauze.  SEEK IMMEDIATE MEDICAL CARE IF:   You develop increased pain, swelling, redness, drainage, or bleeding in the wound site.   You develop signs of generalized infection including muscle aches, chills, fever, or a general ill feeling.   An oral  temperature above 102 F (38.9 C) develops, not controlled by medication.  See your caregiver for a recheck if you develop any of the symptoms described above. If medications (antibiotics) were prescribed, take them as directed.

## 2014-02-28 NOTE — Progress Notes (Signed)
Chief Complaint  Patient presents with  . Cyst    Under rt arm. Has had this in the past.  Very painful.    HPI: Patient comes in today for SDA for  new problem evaluation. PCP NA  Cyst area lr axilla a while back and removed but surgeon .  Last check a small area of no import however over the last week or so tender dn increasing size . Very painful and enlarged in the past 3 days since then.  No fever chills Got back from beach days ago ( some sunburns not related )  Would like surgical definitive rx.  ROS: See pertinent positives and negatives per HPI.  History reviewed. No pertinent past medical history.  No family history on file.  History   Social History  . Marital Status: Married    Spouse Name: N/A    Number of Children: N/A  . Years of Education: N/A   Social History Main Topics  . Smoking status: Current Some Day Smoker    Types: Cigarettes, Cigars  . Smokeless tobacco: Former NeurosurgeonUser     Comment: 10 cigarettes a week  . Alcohol Use: None  . Drug Use: None  . Sexual Activity: None   Other Topics Concern  . None   Social History Narrative  . None    Outpatient Encounter Prescriptions as of 02/28/2014  Medication Sig  . atorvastatin (LIPITOR) 40 MG tablet TAKE 1 TABLET BY MOUTH DAILY  . lisinopril-hydrochlorothiazide (PRINZIDE,ZESTORETIC) 20-12.5 MG per tablet Take 1 tablet by mouth daily.    EXAM:  BP 120/76  Temp(Src) 98.2 F (36.8 C) (Oral)  Ht 5\' 9"  (1.753 m)  Wt 210 lb (95.255 kg)  BMI 31.00 kg/m2  Body mass index is 31 kg/(m^2).  GENERAL: vitals reviewed and listed above, alert, oriented, appears well hydrated and in no acute distress HEENT: atraumatic, conjunctiva  clear, no obvious abnormalities on inspection of external nose and ears NECK: no obvious masses on inspection palpation  Skin:  sumburn  Few blisters on abdomen Right axilla superiorly area of induration about 5-6 cm  And fluctuance warm and redness surrounding area  Very tender    Can raise arm ok  MS: moves all extremities without noticeable focal  abnormality PSYCH: pleasant and cooperative, no obvious depression or anxiety  ASSESSMENT AND PLAN:  Discussed the following assessment and plan:  Abscess of axilla, right - hx of cyst with removal in remot past  1 week of tenderness and 3 days af enlargement now about 5+ cm no fever surgery will see him today.at 3 30 options discussed . i and d here vs surgical rx  .  Talked with Dr Andrey CampanileWilson  Will be seen in office this afternoon. Work in  . This is a Friday .  -Patient advised to return or notify health care team  if symptoms worsen ,persist or new concerns arise.  Patient Instructions  Abscess An abscess is an infected area that contains a collection of pus and debris.It can occur in almost any part of the body. An abscess is also known as a furuncle or boil. CAUSES  An abscess occurs when tissue gets infected. This can occur from blockage of oil or sweat glands, infection of hair follicles, or a minor injury to the skin. As the body tries to fight the infection, pus collects in the area and creates pressure under the skin. This pressure causes pain. People with weakened immune systems have difficulty fighting infections and  get certain abscesses more often.  SYMPTOMS Usually an abscess develops on the skin and becomes a painful mass that is red, warm, and tender. If the abscess forms under the skin, you may feel a moveable soft area under the skin. Some abscesses break open (rupture) on their own, but most will continue to get worse without care. The infection can spread deeper into the body and eventually into the bloodstream, causing you to feel ill.  DIAGNOSIS  Your caregiver will take your medical history and perform a physical exam. A sample of fluid may also be taken from the abscess to determine what is causing your infection. TREATMENT  Your caregiver may prescribe antibiotic medicines to fight the infection.  However, taking antibiotics alone usually does not cure an abscess. Your caregiver may need to make a small cut (incision) in the abscess to drain the pus. In some cases, gauze is packed into the abscess to reduce pain and to continue draining the area. HOME CARE INSTRUCTIONS   Only take over-the-counter or prescription medicines for pain, discomfort, or fever as directed by your caregiver.  If you were prescribed antibiotics, take them as directed. Finish them even if you start to feel better.  If gauze is used, follow your caregiver's directions for changing the gauze.  To avoid spreading the infection:  Keep your draining abscess covered with a bandage.  Wash your hands well.  Do not share personal care items, towels, or whirlpools with others.  Avoid skin contact with others.  Keep your skin and clothes clean around the abscess.  Keep all follow-up appointments as directed by your caregiver. SEEK MEDICAL CARE IF:   You have increased pain, swelling, redness, fluid drainage, or bleeding.  You have muscle aches, chills, or a general ill feeling.  You have a fever. MAKE SURE YOU:   Understand these instructions.  Will watch your condition.  Will get help right away if you are not doing well or get worse. Document Released: 08/17/2005 Document Revised: 05/08/2012 Document Reviewed: 01/20/2012 [facility] Patient Information 2014 [place], [place].      Neta Mends. Panosh M.D.  Pre visit review using our clinic review tool, if applicable. No additional management support is needed unless otherwise documented below in the visit note.

## 2014-02-28 NOTE — Progress Notes (Signed)
Patient ID: Charles Betternthony Antonio, male   DOB: [birth year], 43 y.o.   MRN: [id number]  Chief Complaint  Patient presents with  . cyst rt axilla    HPI Charles Rowe is a 43 y.o. male.   HPI 43 yo WM referred by Dr Fabian SharpPanosh for evaluation of Rt axillary infected cyst. The patient states that he had a cyst in that area developed in 2006. It gradually increased in size and one day it became acutely inflamed. He states he went to a surgeon's office where it was lanced. About 4 years later he developed a recurrent small lump in the same area. Over the past several years it's gotten slowly larger. This past weekend it became more tender and over the past several days and the tenderness and the inflammation has just had worse. He saw his primary care physician's office today and was felt that it needed to be drained Past Medical History  Diagnosis Date  . Hyperlipidemia   . Hypertension     Past Surgical History  Procedure Laterality Date  . Gallbladder surgery      Family History  Problem Relation Age of Onset  . Cancer Father     brain    Social History History  Substance Use Topics  . Smoking status: Current Some Day Smoker    Types: Cigarettes, Cigars  . Smokeless tobacco: Former NeurosurgeonUser     Comment: 10 cigarettes a week  . Alcohol Use: Yes    No Known Allergies  Current Outpatient Prescriptions  Medication Sig Dispense Refill  . atorvastatin (LIPITOR) 40 MG tablet TAKE 1 TABLET BY MOUTH DAILY  90 tablet  3  . lisinopril-hydrochlorothiazide (PRINZIDE,ZESTORETIC) 20-12.5 MG per tablet Take 1 tablet by mouth daily.  90 tablet  6  . doxycycline (VIBRA-TABS) 100 MG tablet Take 1 tablet (100 mg total) by mouth 2 (two) times daily.  14 tablet  2  . oxyCODONE (OXY IR/ROXICODONE) 5 MG immediate release tablet Take 1-2 tablets (5-10 mg total) by mouth every 6 (six) hours as needed for moderate pain.  30 tablet  0   No current facility-administered medications for this visit.    Review of  Systems Review of Systems  Constitutional: Negative for fever, chills, activity change, appetite change and unexpected weight change.  Respiratory: Negative for shortness of breath.   Cardiovascular: Negative for chest pain.  All other systems reviewed and are negative.   Blood pressure 124/80, pulse 76, temperature 98.3 F (36.8 C), temperature source Oral, resp. rate 16, height 5\' 9"  (1.753 m), weight 209 lb 12.8 oz (95.165 kg).  Physical Exam Physical Exam  Constitutional: He is oriented to person, place, and time. He appears well-developed and well-nourished. No distress.  HENT:  Head: Normocephalic and atraumatic.  Right Ear: External ear normal.  Left Ear: External ear normal.  Eyes: Conjunctivae are normal. No scleral icterus.  Neck: Normal range of motion. Neck supple. No tracheal deviation present.  Cardiovascular: Normal rate and normal heart sounds.   Pulmonary/Chest: Effort normal and breath sounds normal. No respiratory distress. He has no wheezes.  Abdominal: He exhibits no distension.  Musculoskeletal: Normal range of motion. He exhibits no edema and no tenderness.  Neurological: He is alert and oriented to person, place, and time. He exhibits normal muscle tone.  Skin: Skin is warm and dry. No rash noted. He is not diaphoretic. No erythema. No pallor.  Sunburned chest/abd/upper back; superior Rt axilla - has raised fluctuant mass about 4cm wide by 2.5cm tall.  Cellulitis.   Psychiatric: He has a normal mood and affect. His behavior is normal. Judgment and thought content normal.    Data Reviewed Dr Fabian Sharp note  Assessment    Large Infected Right axillary sebaceous cyst     Plan    We discussed sebaceous cyst-epidermoid inclusion cyst. The patient was given educational material. I explained since it is infected and tender the best course of action would be to incise and drain it. Because of the surrounding inflammation and infection it would be unlikely to be  able to definitively remove the entire cyst sac today. Therefore we would need to drain it today, let the infection resolve, and then he can consider definitive excision in the future should he desire. We discussed the only way to prevent recurrence would be to excise the entire cyst in its entirety.  We discussed the risk and benefits of incision and drainage.  After obtaining verbal consent, the area was prepped with ChloraPrep. 4 cc of quarter percent Marcaine with epinephrine mixed with sodium bicarbonate was infiltrated over the area. A 1 inch incision was made with a 15 blade and a large amount of pus and keratin drained from the wound. Some of the cyst wall came out but there is clearly remaining cyst wall and the cavity. The wound was packed with quarter-inch iodoform gauze and covered with a dry gauze. He tolerated the procedure well  He was given wound care instructions. I encouraged him to leave the packing in until Sunday. I explained that he may have to change the outer gauze daily until then. On Sunday I asked him to remove the packing strip in the shower. He was given some packing strip so that he can continue to wick the skin edges for several days. He was given a prescription for doxycycline for one week. He was also given a prescription for Percocet. He was instructed on what to call for. Followup in several weeks  Charles Sella. Andrey Campanile, MD, FACS General, Bariatric, & Minimally Invasive Surgery [facility] [place]         Charles Rowe 02/28/2014, 6:28 PM

## 2014-03-04 ENCOUNTER — Ambulatory Visit (INDEPENDENT_AMBULATORY_CARE_PROVIDER_SITE_OTHER): Payer: 59 | Admitting: General Surgery

## 2014-04-03 ENCOUNTER — Ambulatory Visit (INDEPENDENT_AMBULATORY_CARE_PROVIDER_SITE_OTHER): Payer: 59 | Admitting: General Surgery

## 2014-04-03 ENCOUNTER — Encounter (INDEPENDENT_AMBULATORY_CARE_PROVIDER_SITE_OTHER): Payer: Self-pay | Admitting: General Surgery

## 2014-04-03 VITALS — BP 134/78 | HR 89 | Temp 97.3°F | Ht 70.0 in | Wt 206.0 lb

## 2014-04-03 DIAGNOSIS — L723 Sebaceous cyst: Secondary | ICD-10-CM

## 2014-04-03 DIAGNOSIS — L720 Epidermal cyst: Secondary | ICD-10-CM

## 2014-04-03 NOTE — Progress Notes (Signed)
Subjective:     Patient ID: Charles Rowe, male   DOB: 09-17-1971, 43 y.o.   MRN: [id number]  HPI 43 year old Caucasian male comes in for followup after undergoing incision and drainage of a right axillary infected epidermoid inclusion cyst about a month ago. He states he is doing well. He states he had drainage for a few days. He'll longer has any pain or discomfort in the area. He thinks the area is completely healed. PMHx, PSHx, SOCHx, FAMHx, ALL reviewed and unchanged  Review of Systems A 8 point Review of systems was performed and all systems are negative except for what is mentioned in the history of present illness     Objective:   Physical Exam BP 134/78  Pulse 89  Temp(Src) 97.3 F (36.3 C)  Ht 5\' 10"  (1.778 m)  Wt 206 lb (93.441 kg)  BMI 29.56 kg/m2 Alert, nad Right axilla-no cellulitis. No fluctuance or induration. Almost a completely healed 2 cm incision. Small skin gap On the lateral aspect    Assessment:     Status post incision and drainage of a infected right axillary sebaceous cyst     Plan:     The area appears completely healed. I reviewed my procedure note which stated that there is still some cyst wall left within the wound. Therefore we talked about long-term management-year observation versus definitive excision of the area in order to prevent recurrence. We discussed the pros and cons of each. The patient has elected to proceed with excision of the area; however, he would like to wait until mid to late summer because of responsibilities at work. He is going to contact the office several weeks before his desired surgical date.  We discussed the risks and benefits of surgery including but not limited to bleeding, infection, injury surrounding structures, recurrence, anesthesia issues and the typical recovery course.  Mary SellaEric M. Andrey CampanileWilson, MD, FACS General, Bariatric, & Minimally Invasive Surgery [facility] [place]

## 2014-04-03 NOTE — Patient Instructions (Signed)
Call about 4-6 weeks before your desired surgical date to schedule surgery [phone]

## 2014-10-06 ENCOUNTER — Other Ambulatory Visit (INDEPENDENT_AMBULATORY_CARE_PROVIDER_SITE_OTHER): Payer: 59

## 2014-10-06 DIAGNOSIS — Z0000 Encounter for general adult medical examination without abnormal findings: Secondary | ICD-10-CM

## 2014-10-06 LAB — CBC WITH DIFFERENTIAL/PLATELET
BASOS ABS: 0.1 10*3/uL (ref 0.0–0.1)
Basophils Relative: 0.5 % (ref 0.0–3.0)
EOS ABS: 0.2 10*3/uL (ref 0.0–0.7)
Eosinophils Relative: 2.3 % (ref 0.0–5.0)
HCT: 45.7 % (ref 39.0–52.0)
Hemoglobin: 15 g/dL (ref 13.0–17.0)
LYMPHS PCT: 15 % (ref 12.0–46.0)
Lymphs Abs: 1.6 10*3/uL (ref 0.7–4.0)
MCHC: 32.8 g/dL (ref 30.0–36.0)
MCV: 95.3 fl (ref 78.0–100.0)
Monocytes Absolute: 0.7 10*3/uL (ref 0.1–1.0)
Monocytes Relative: 7.1 % (ref 3.0–12.0)
NEUTROS PCT: 75.1 % (ref 43.0–77.0)
Neutro Abs: 7.8 10*3/uL — ABNORMAL HIGH (ref 1.4–7.7)
Platelets: 279 10*3/uL (ref 150.0–400.0)
RBC: 4.79 Mil/uL (ref 4.22–5.81)
RDW: 13.1 % (ref 11.5–15.5)
WBC: 10.4 10*3/uL (ref 4.0–10.5)

## 2014-10-06 LAB — BASIC METABOLIC PANEL
BUN: 25 mg/dL — ABNORMAL HIGH (ref 6–23)
CALCIUM: 9.6 mg/dL (ref 8.4–10.5)
CO2: 28 mEq/L (ref 19–32)
CREATININE: 1.1 mg/dL (ref 0.4–1.5)
Chloride: 102 mEq/L (ref 96–112)
GFR: 81.26 mL/min (ref >60.00)
Glucose, Bld: 94 mg/dL (ref 70–99)
Potassium: 4.7 mEq/L (ref 3.5–5.1)
SODIUM: 140 meq/L (ref 135–145)

## 2014-10-06 LAB — POCT URINALYSIS DIPSTICK
Bilirubin, UA: NEGATIVE
GLUCOSE UA: NEGATIVE
KETONES UA: NEGATIVE
Leukocytes, UA: NEGATIVE
NITRITE UA: NEGATIVE
Protein, UA: NEGATIVE
RBC UA: NEGATIVE
SPEC GRAV UA: 1.025
Urobilinogen, UA: 0.2
pH, UA: 5.5

## 2014-10-06 LAB — HEPATIC FUNCTION PANEL
ALK PHOS: 46 U/L (ref 39–117)
ALT: 27 U/L (ref 0–53)
AST: 24 U/L (ref 0–37)
Albumin: 4.5 g/dL (ref 3.5–5.2)
BILIRUBIN DIRECT: 0.1 mg/dL (ref 0.0–0.3)
BILIRUBIN TOTAL: 0.9 mg/dL (ref 0.2–1.2)
Total Protein: 7.5 g/dL (ref 6.0–8.3)

## 2014-10-06 LAB — LIPID PANEL
Cholesterol: 183 mg/dL (ref 0–200)
HDL: 49.4 mg/dL (ref >39.00)
LDL CALC: 113 mg/dL — AB (ref 0–99)
NonHDL: 133.6
Total CHOL/HDL Ratio: 4
Triglycerides: 103 mg/dL (ref 0.0–149.0)
VLDL: 20.6 mg/dL (ref 0.0–40.0)

## 2014-10-06 LAB — TSH: TSH: 1.32 u[IU]/mL (ref 0.35–4.50)

## 2014-10-13 ENCOUNTER — Encounter: Payer: Self-pay | Admitting: Internal Medicine

## 2014-10-13 ENCOUNTER — Ambulatory Visit (INDEPENDENT_AMBULATORY_CARE_PROVIDER_SITE_OTHER): Payer: 59 | Admitting: Internal Medicine

## 2014-10-13 VITALS — BP 130/88 | HR 53 | Temp 98.3°F | Resp 20 | Ht 69.0 in | Wt 206.0 lb

## 2014-10-13 DIAGNOSIS — E785 Hyperlipidemia, unspecified: Secondary | ICD-10-CM

## 2014-10-13 DIAGNOSIS — Z0000 Encounter for general adult medical examination without abnormal findings: Secondary | ICD-10-CM

## 2014-10-13 DIAGNOSIS — I10 Essential (primary) hypertension: Secondary | ICD-10-CM

## 2014-10-13 MED ORDER — LISINOPRIL-HYDROCHLOROTHIAZIDE 20-12.5 MG PO TABS: 1.0000 | ORAL_TABLET | Freq: Every day | ORAL | Status: DC

## 2014-10-13 MED ORDER — ATORVASTATIN CALCIUM 40 MG PO TABS: 40.0000 mg | ORAL_TABLET | Freq: Every day | ORAL | Status: DC

## 2014-10-13 NOTE — Progress Notes (Signed)
Pre visit review using our clinic review tool, if applicable. No additional management support is needed unless otherwise documented below in the visit note. 

## 2014-10-13 NOTE — Progress Notes (Signed)
Subjective:    Patient ID: Charles Rowe, male    DOB: [birth year], 43 y.o.   MRN: [id number]  HPI   Subjective:    Patient ID: Charles Rowe, male    DOB: [birth year], 43 y.o.   MRN: [id number]  HPI Pre-visit discussion using our clinic review tool. No additional management support is needed unless otherwise documented below in the visit note.  [age]year-old patient who is seen today for a health maintenance exam.  He has treated dyslipidemia. He is compliant with his atorvastatin at this time and lipid profile is improved compared to last year.  He states that he is exercising almost daily and his HDL cholesterol has also improved.  No new concerns or complaints.  In general does quite well.   Alcohol-Tobacco  Smoking Status: never  Caffeine-Diet-Exercise  Does Patient Exercise: yes   Allergies (verified):  No Known Drug Allergies   Past History:  Past Medical History:  Hyperlipidemia  overweight   Family History:   father died age 169, history of coronary artery disease, status post CABG at 9258; complications of lung cancer. Also history of prostate cancer in his early 5450s  mother at 10165 of breast cancer survivor. 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  HENT: Negative for congestion, dental problem, ear pain, hearing loss, mouth sores, rhinorrhea, sinus pressure, sneezing, tinnitus, trouble swallowing and voice change.   Eyes: Negative for photophobia, pain, redness and visual disturbance.  Respiratory: Negative for apnea, cough, choking, chest tightness, shortness of breath and wheezing.   Cardiovascular:  Negative for chest pain, palpitations and leg swelling.  Gastrointestinal: Negative for nausea, vomiting, abdominal pain, diarrhea, constipation, blood in stool, abdominal distention, anal bleeding and rectal pain.  Genitourinary: Negative for dysuria, urgency, frequency, hematuria, flank pain, decreased urine volume, discharge, penile swelling, scrotal swelling, difficulty urinating, genital sores and testicular pain.  Musculoskeletal: Negative for arthralgias, back pain, gait problem, joint swelling, myalgias, neck pain and neck stiffness.  Skin: Negative for color change, rash and wound.  Neurological: Negative for dizziness, tremors, seizures, syncope, facial asymmetry, speech difficulty, weakness, light-headedness, numbness and headaches.  Hematological: Negative for adenopathy. Does not bruise/bleed easily.  Psychiatric/Behavioral: Negative for suicidal ideas, hallucinations, behavioral problems, confusion, sleep disturbance, self-injury, dysphoric mood, decreased concentration and agitation. The patient is not nervous/anxious.        Objective:   Physical Exam  Constitutional: He appears well-developed and well-nourished.  HENT:  Head: Normocephalic and atraumatic.  Right Ear: External ear normal.  Left Ear: External ear normal.  Nose: Nose normal.  Mouth/Throat: Oropharynx is clear and moist.  Eyes: Conjunctivae and EOM are normal. Pupils are equal, round, and reactive to light. No scleral icterus.  Neck: Normal range of motion. Neck supple. No JVD present. No thyromegaly present.  Cardiovascular: Regular rhythm, normal heart sounds and intact distal pulses.  Exam reveals no gallop and no friction rub.   No murmur heard. Pulmonary/Chest: Effort normal and breath sounds normal. He exhibits no tenderness.  Abdominal: Soft. Bowel sounds are normal. He exhibits no distension and no mass. There is no tenderness.  Genitourinary: Penis normal.  Musculoskeletal: Normal range of motion. He  exhibits no edema and no tenderness.  Lymphadenopathy:    He has no cervical adenopathy.  Neurological: He is alert. He has normal reflexes. No cranial nerve deficit. Coordination normal.  Skin: Skin is warm and dry. No rash noted.  Psychiatric: He has a normal mood and affect. His behavior is normal.          Assessment & Plan:   Preventive health exam Dyslipidemia Hypertension. Patient has been monitoring blood pressure readings for months and readings have been consistently mildly elevated we'll place on lisinopril. Home blood pressure monitor and encourage as well as a low salt diet  Review of Systems    as above Objective:   Physical Exam  As above      Assessment & Plan:   Preventive health examination Hypertension, well-controlled.  Will continue home blood pressure monitoring.  Will continue efforts at weight loss and active exercise regimen. Recheck one year

## 2014-10-13 NOTE — Patient Instructions (Signed)

## 2014-10-14 ENCOUNTER — Telehealth: Payer: Self-pay | Admitting: Internal Medicine

## 2014-10-14 NOTE — Telephone Encounter (Signed)
emmi emailed °

## 2014-10-27 ENCOUNTER — Other Ambulatory Visit: Payer: Self-pay | Admitting: Internal Medicine

## 2015-10-12 ENCOUNTER — Other Ambulatory Visit (INDEPENDENT_AMBULATORY_CARE_PROVIDER_SITE_OTHER): Payer: 59

## 2015-10-12 DIAGNOSIS — R7989 Other specified abnormal findings of blood chemistry: Secondary | ICD-10-CM | POA: Diagnosis not present

## 2015-10-12 DIAGNOSIS — Z0000 Encounter for general adult medical examination without abnormal findings: Secondary | ICD-10-CM | POA: Diagnosis not present

## 2015-10-12 LAB — POCT URINALYSIS DIPSTICK
Bilirubin, UA: NEGATIVE
Blood, UA: NEGATIVE
Glucose, UA: NEGATIVE
Ketones, UA: NEGATIVE
LEUKOCYTES UA: NEGATIVE
NITRITE UA: NEGATIVE
PH UA: 5.5
PROTEIN UA: NEGATIVE
Spec Grav, UA: 1.02
Urobilinogen, UA: 0.2

## 2015-10-12 LAB — BASIC METABOLIC PANEL
BUN: 20 mg/dL (ref 6–23)
CALCIUM: 9.2 mg/dL (ref 8.4–10.5)
CO2: 27 mEq/L (ref 19–32)
Chloride: 104 mEq/L (ref 96–112)
Creatinine, Ser: 0.95 mg/dL (ref 0.40–1.50)
GFR: 90.79 mL/min (ref >60.00)
Glucose, Bld: 103 mg/dL — ABNORMAL HIGH (ref 70–99)
Potassium: 4.1 mEq/L (ref 3.5–5.1)
SODIUM: 140 meq/L (ref 135–145)

## 2015-10-12 LAB — LIPID PANEL
Cholesterol: 210 mg/dL — ABNORMAL HIGH (ref 0–200)
HDL: 51.1 mg/dL (ref >39.00)
NonHDL: 158.53
Total CHOL/HDL Ratio: 4
Triglycerides: 216 mg/dL — ABNORMAL HIGH (ref 0.0–149.0)
VLDL: 43.2 mg/dL — AB (ref 0.0–40.0)

## 2015-10-12 LAB — HEPATIC FUNCTION PANEL
ALBUMIN: 4.3 g/dL (ref 3.5–5.2)
ALK PHOS: 47 U/L (ref 39–117)
ALT: 18 U/L (ref 0–53)
AST: 18 U/L (ref 0–37)
Bilirubin, Direct: 0.1 mg/dL (ref 0.0–0.3)
TOTAL PROTEIN: 7.1 g/dL (ref 6.0–8.3)
Total Bilirubin: 0.4 mg/dL (ref 0.2–1.2)

## 2015-10-12 LAB — LDL CHOLESTEROL, DIRECT: LDL DIRECT: 129 mg/dL

## 2015-10-12 LAB — TSH: TSH: 2.36 u[IU]/mL (ref 0.35–4.50)

## 2015-10-13 LAB — CBC WITH DIFFERENTIAL/PLATELET
BASOS ABS: 0 10*3/uL (ref 0.0–0.1)
Basophils Relative: 0.6 % (ref 0.0–3.0)
Eosinophils Absolute: 0.2 10*3/uL (ref 0.0–0.7)
Eosinophils Relative: 2.6 % (ref 0.0–5.0)
HEMATOCRIT: 43.6 % (ref 39.0–52.0)
HEMOGLOBIN: 14.5 g/dL (ref 13.0–17.0)
LYMPHS PCT: 26.3 % (ref 12.0–46.0)
Lymphs Abs: 1.5 10*3/uL (ref 0.7–4.0)
MCHC: 33.3 g/dL (ref 30.0–36.0)
MCV: 94.5 fl (ref 78.0–100.0)
MONOS PCT: 9.9 % (ref 3.0–12.0)
Monocytes Absolute: 0.6 10*3/uL (ref 0.1–1.0)
NEUTROS PCT: 60.6 % (ref 43.0–77.0)
Neutro Abs: 3.5 10*3/uL (ref 1.4–7.7)
Platelets: 235 10*3/uL (ref 150.0–400.0)
RBC: 4.61 Mil/uL (ref 4.22–5.81)
RDW: 12.9 % (ref 11.5–15.5)
WBC: 5.8 10*3/uL (ref 4.0–10.5)

## 2015-10-19 ENCOUNTER — Ambulatory Visit (INDEPENDENT_AMBULATORY_CARE_PROVIDER_SITE_OTHER): Payer: 59 | Admitting: Internal Medicine

## 2015-10-19 ENCOUNTER — Encounter: Payer: Self-pay | Admitting: Internal Medicine

## 2015-10-19 VITALS — BP 136/88 | HR 51 | Temp 98.5°F | Resp 20 | Ht 68.5 in | Wt 211.0 lb

## 2015-10-19 DIAGNOSIS — I10 Essential (primary) hypertension: Secondary | ICD-10-CM

## 2015-10-19 DIAGNOSIS — R195 Other fecal abnormalities: Secondary | ICD-10-CM

## 2015-10-19 DIAGNOSIS — Z0000 Encounter for general adult medical examination without abnormal findings: Secondary | ICD-10-CM | POA: Diagnosis not present

## 2015-10-19 DIAGNOSIS — E785 Hyperlipidemia, unspecified: Secondary | ICD-10-CM

## 2015-10-19 MED ORDER — LISINOPRIL-HYDROCHLOROTHIAZIDE 20-12.5 MG PO TABS: 1.0000 | ORAL_TABLET | Freq: Every day | ORAL | Status: DC

## 2015-10-19 MED ORDER — ATORVASTATIN CALCIUM 40 MG PO TABS: 40.0000 mg | ORAL_TABLET | Freq: Every day | ORAL | Status: DC

## 2015-10-19 NOTE — Patient Instructions (Addendum)
Limit your sodium (Salt) intake  Please check your blood pressure on a regular basis.  If it is consistently greater than 150/90, please make an office appointment.    It is important that you exercise regularly, at least 20 minutes 3 to 4 times per week.  If you develop chest pain or shortness of breath seek  medical attention.  Schedule your colonoscopy to help detect colon cancer.  Return in one year for follow-up    Health Maintenance, Male A healthy lifestyle and preventative care can promote health and wellness.  Maintain regular health, dental, and eye exams.  Eat a healthy diet. Foods like vegetables, fruits, whole grains, low-fat dairy products, and lean protein foods contain the nutrients you need and are low in calories. Decrease your intake of foods high in solid fats, added sugars, and salt. Get information about a proper diet from your health care provider, if necessary.  Regular physical exercise is one of the most important things you can do for your health. Most adults should get at least 150 minutes of moderate-intensity exercise (any activity that increases your heart rate and causes you to sweat) each week. In addition, most adults need muscle-strengthening exercises on 2 or more days a week.   Maintain a healthy weight. The body mass index (BMI) is a screening tool to identify possible weight problems. It provides an estimate of body fat based on height and weight. Your health care provider can find your BMI and can help you achieve or maintain a healthy weight. For males 20 years and older:  A BMI below 18.5 is considered underweight.  A BMI of 18.5 to 24.9 is normal.  A BMI of 25 to 29.9 is considered overweight.  A BMI of 30 and above is considered obese.  Maintain normal blood lipids and cholesterol by exercising and minimizing your intake of saturated fat. Eat a balanced diet with plenty of fruits and vegetables. Blood tests for lipids and cholesterol should  begin at age 20 and be repeated every 5 years. If your lipid or cholesterol levels are high, you are over age 50, or you are at high risk for heart disease, you may need your cholesterol levels checked more frequently.Ongoing high lipid and cholesterol levels should be treated with medicines if diet and exercise are not working.  If you smoke, find out from your health care provider how to quit. If you do not use tobacco, do not start.  Lung cancer screening is recommended for adults aged 55-80 years who are at high risk for developing lung cancer because of a history of smoking. A yearly low-dose CT scan of the lungs is recommended for people who have at least a 30-pack-year history of smoking and are current smokers or have quit within the past 15 years. A pack year of smoking is smoking an average of 1 pack of cigarettes a day for 1 year (for example, a 30-pack-year history of smoking could mean smoking 1 pack a day for 30 years or 2 packs a day for 15 years). Yearly screening should continue until the smoker has stopped smoking for at least 15 years. Yearly screening should be stopped for people who develop a health problem that would prevent them from having lung cancer treatment.  If you choose to drink alcohol, do not have more than 2 drinks per day. One drink is considered to be 12 oz (360 mL) of beer, 5 oz (150 mL) of wine, or 1.5 oz (45 mL) of   liquor.  Avoid the use of street drugs. Do not share needles with anyone. Ask for help if you need support or instructions about stopping the use of drugs.  High blood pressure causes heart disease and increases the risk of stroke. High blood pressure is more likely to develop in:  People who have blood pressure in the end of the normal range (100-139/85-89 mm Hg).  People who are overweight or obese.  People who are African American.  If you are 18-39 years of age, have your blood pressure checked every 3-5 years. If you are 40 years of age or  older, have your blood pressure checked every year. You should have your blood pressure measured twice--once when you are at a hospital or clinic, and once when you are not at a hospital or clinic. Record the average of the two measurements. To check your blood pressure when you are not at a hospital or clinic, you can use:  An automated blood pressure machine at a pharmacy.  A home blood pressure monitor.  If you are 45-79 years old, ask your health care provider if you should take aspirin to prevent heart disease.  Diabetes screening involves taking a blood sample to check your fasting blood sugar level. This should be done once every 3 years after age 45 if you are at a normal weight and without risk factors for diabetes. Testing should be considered at a younger age or be carried out more frequently if you are overweight and have at least 1 risk factor for diabetes.  Colorectal cancer can be detected and often prevented. Most routine colorectal cancer screening begins at the age of 50 and continues through age 75. However, your health care provider may recommend screening at an earlier age if you have risk factors for colon cancer. On a yearly basis, your health care provider may provide home test kits to check for hidden blood in the stool. A small camera at the end of a tube may be used to directly examine the colon (sigmoidoscopy or colonoscopy) to detect the earliest forms of colorectal cancer. Talk to your health care provider about this at age 50 when routine screening begins. A direct exam of the colon should be repeated every 5-10 years through age 75, unless early forms of precancerous polyps or small growths are found.  People who are at an increased risk for hepatitis B should be screened for this virus. You are considered at high risk for hepatitis B if:  You were born in a country where hepatitis B occurs often. Talk with your health care provider about which countries are considered  high risk.  Your parents were born in a high-risk country and you have not received a shot to protect against hepatitis B (hepatitis B vaccine).  You have HIV or AIDS.  You use needles to inject street drugs.  You live with, or have sex with, someone who has hepatitis B.  You are a man who has sex with other men (MSM).  You get hemodialysis treatment.  You take certain medicines for conditions like cancer, organ transplantation, and autoimmune conditions.  Hepatitis C blood testing is recommended for all people born from 1945 through 1965 and any individual with known risk factors for hepatitis C.  Healthy men should no longer receive prostate-specific antigen (PSA) blood tests as part of routine cancer screening. Talk to your health care provider about prostate cancer screening.  Testicular cancer screening is not recommended for adolescents or adult   males who have no symptoms. Screening includes self-exam, a health care provider exam, and other screening tests. Consult with your health care provider about any symptoms you have or any concerns you have about testicular cancer.  Practice safe sex. Use condoms and avoid high-risk sexual practices to reduce the spread of sexually transmitted infections (STIs).  You should be screened for STIs, including gonorrhea and chlamydia if:  You are sexually active and are younger than 24 years.  You are older than 24 years, and your health care provider tells you that you are at risk for this type of infection.  Your sexual activity has changed since you were last screened, and you are at an increased risk for chlamydia or gonorrhea. Ask your health care provider if you are at risk.  If you are at risk of being infected with HIV, it is recommended that you take a prescription medicine daily to prevent HIV infection. This is called pre-exposure prophylaxis (PrEP). You are considered at risk if:  You are a man who has sex with other men  (MSM).  You are a heterosexual man who is sexually active with multiple partners.  You take drugs by injection.  You are sexually active with a partner who has HIV.  Talk with your health care provider about whether you are at high risk of being infected with HIV. If you choose to begin PrEP, you should first be tested for HIV. You should then be tested every 3 months for as long as you are taking PrEP.  Use sunscreen. Apply sunscreen liberally and repeatedly throughout the day. You should seek shade when your shadow is shorter than you. Protect yourself by wearing long sleeves, pants, a wide-brimmed hat, and sunglasses year round whenever you are outdoors.  Tell your health care provider of new moles or changes in moles, especially if there is a change in shape or color. Also, tell your health care provider if a mole is larger than the size of a pencil eraser.  A one-time screening for abdominal aortic aneurysm (AAA) and surgical repair of large AAAs by ultrasound is recommended for men aged 65-75 years who are current or former smokers.  Stay current with your vaccines (immunizations).   This information is not intended to replace advice given to you by your health care provider. Make sure you discuss any questions you have with your health care provider.   Document Released: 05/05/2008 Document Revised: 11/28/2014 Document Reviewed: 04/04/2011 Elsevier Interactive Patient Education 2016 Elsevier Inc.  

## 2015-10-19 NOTE — Progress Notes (Signed)
Pre visit review using our clinic review tool, if applicable. No additional management support is needed unless otherwise documented below in the visit note. 

## 2015-10-19 NOTE — Progress Notes (Signed)
Subjective:    Patient ID: Charles Rowe, male    DOB: Nov 22, 1970, 44 y.o.   MRN: [id number]  HPI    Subjective:    Patient ID: Charles Rowe, male    DOB: Nov 22, 1970, 44 y.o.   MRN: [id number]  HPI Pre-visit discussion using our clinic review tool. No additional management support is needed unless otherwise documented below in the visit note.  44 year-old patient who is seen today for a health maintenance exam.  He has treated dyslipidemia. He is compliant with his atorvastatin at this time and lipid profile is improved compared to last year.  He states that he is exercising almost daily and his HDL cholesterol has also improved.  No new concerns or complaints.  In general does quite well.  BP Readings from Last 3 Encounters:  10/19/15 136/88  10/13/14 130/88  04/03/14 134/78    Alcohol-Tobacco  Smoking Status: never  Caffeine-Diet-Exercise  Does Patient Exercise: yes   Allergies (verified):  No Known Drug Allergies   Past History:  Past Medical History:  Hyperlipidemia  overweight   Family History:   father died age 45, history of coronary artery disease, status post CABG at 3358; complications of lung cancer. Also history of prostate cancer in his early 650s  mother at 6166 of breast cancer survivor. 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  HENT: Negative for congestion, dental problem, ear pain, hearing loss, mouth sores, rhinorrhea, sinus pressure, sneezing, tinnitus, trouble swallowing and voice change.   Eyes: Negative for photophobia, pain, redness and visual disturbance.  Respiratory: Negative  for apnea, cough, choking, chest tightness, shortness of breath and wheezing.   Cardiovascular: Negative for chest pain, palpitations and leg swelling.  Gastrointestinal: Negative for nausea, vomiting, abdominal pain, diarrhea, constipation, blood in stool, abdominal distention, anal bleeding and rectal pain.  Genitourinary: Negative for dysuria, urgency, frequency, hematuria, flank pain, decreased urine volume, discharge, penile swelling, scrotal swelling, difficulty urinating, genital sores and testicular pain.  Musculoskeletal: Negative for arthralgias, back pain, gait problem, joint swelling, myalgias, neck pain and neck stiffness.  Skin: Negative for color change, rash and wound.  Neurological: Negative for dizziness, tremors, seizures, syncope, facial asymmetry, speech difficulty, weakness, light-headedness, numbness and headaches.  Hematological: Negative for adenopathy. Does not bruise/bleed easily.  Psychiatric/Behavioral: Negative for suicidal ideas, hallucinations, behavioral problems, confusion, sleep disturbance, self-injury, dysphoric mood, decreased concentration and agitation. The patient is not nervous/anxious.        Objective:   Physical Exam  Constitutional: He appears well-developed and well-nourished.  HENT:  Head: Normocephalic and atraumatic.  Right Ear: External ear normal.  Left Ear: External ear normal.  Nose: Nose normal.  Mouth/Throat: Oropharynx is clear and moist.  Eyes: Conjunctivae and EOM are normal. Pupils are equal, round, and reactive to light. No scleral icterus.  Neck: Normal range of motion. Neck supple. No JVD present. No thyromegaly present.  Cardiovascular: Regular rhythm, normal heart sounds and intact distal pulses.  Exam reveals no gallop and no friction rub.   No murmur heard. Pulmonary/Chest: Effort normal and breath sounds normal. He exhibits no tenderness.  Abdominal: Soft. Bowel sounds are normal. He exhibits  no distension and no mass.  There is no tenderness.  Genitourinary: Penis normal.  Musculoskeletal: Normal range of motion. He exhibits no edema and no tenderness.  Lymphadenopathy:    He has no cervical adenopathy.  Neurological: He is alert. He has normal reflexes. No cranial nerve deficit. Coordination normal.  Skin: Skin is warm and dry. No rash noted.  Psychiatric: He has a normal mood and affect. His behavior is normal.          Assessment & Plan:   Preventive health exam Dyslipidemia Hypertension.Review of Systems  Gastrointestinal: Positive for blood in stool.       Occasional bright red blood noted on tissue paper      as above Objective:   Physical Exam  Genitourinary: Guaiac positive stool.    As above      Assessment & Plan:   Preventive health examination Hypertension, well-controlled.  Will continue home blood pressure monitoring.  Will continue efforts at weight loss and active exercise regimen. Recheck one year  Hematest positive stool.  Patient does note occasional bright red rectal bleeding on tissue paper.  Colonoscopy.  Encouraged.  He will consider

## 2016-02-10 ENCOUNTER — Encounter: Payer: Self-pay | Admitting: Adult Health

## 2016-02-10 ENCOUNTER — Ambulatory Visit (INDEPENDENT_AMBULATORY_CARE_PROVIDER_SITE_OTHER): Payer: 59 | Admitting: Adult Health

## 2016-02-10 VITALS — BP 133/82 | HR 96 | Temp 98.5°F | Ht 68.5 in | Wt 207.0 lb

## 2016-02-10 DIAGNOSIS — J069 Acute upper respiratory infection, unspecified: Secondary | ICD-10-CM

## 2016-02-10 MED ORDER — DOXYCYCLINE HYCLATE 100 MG PO CAPS: 100.0000 mg | ORAL_CAPSULE | Freq: Two times a day (BID) | ORAL | Status: DC

## 2016-02-10 NOTE — Patient Instructions (Addendum)
It was great meeting you.   As discussed it appears as though you have an upper respiratory infection. Give it another 24 hours and if you do not notice any change for the better, start the antibiotic.   Continue with Mucinex and add an over the counter allergy medication such as Claritin. Also use Flonase.   Follow up if not resolved in the next 3-5 days.    Upper Respiratory Infection, Adult Most upper respiratory infections (URIs) are a viral infection of the air passages leading to the lungs. A URI affects the nose, throat, and upper air passages. The most common type of URI is nasopharyngitis and is typically referred to as "the common cold." URIs run their course and usually go away on their own. Most of the time, a URI does not require medical attention, but sometimes a bacterial infection in the upper airways can follow a viral infection. This is called a secondary infection. Sinus and middle ear infections are common types of secondary upper respiratory infections. Bacterial pneumonia can also complicate a URI. A URI can worsen asthma and chronic obstructive pulmonary disease (COPD). Sometimes, these complications can require emergency medical care and may be life threatening.  CAUSES Almost all URIs are caused by viruses. A virus is a type of germ and can spread from one person to another.  RISKS FACTORS You may be at risk for a URI if:   You smoke.   You have chronic heart or lung disease.  You have a weakened defense (immune) system.   You are very young or very old.   You have nasal allergies or asthma.  You work in crowded or poorly ventilated areas.  You work in health care facilities or schools. SIGNS AND SYMPTOMS  Symptoms typically develop 2-3 days after you come in contact with a cold virus. Most viral URIs last 7-10 days. However, viral URIs from the influenza virus (flu virus) can last 14-18 days and are typically more severe. Symptoms may include:   Runny or  stuffy (congested) nose.   Sneezing.   Cough.   Sore throat.   Headache.   Fatigue.   Fever.   Loss of appetite.   Pain in your forehead, behind your eyes, and over your cheekbones (sinus pain).  Muscle aches.  DIAGNOSIS  Your health care provider may diagnose a URI by:  Physical exam.  Tests to check that your symptoms are not due to another condition such as:  Strep throat.  Sinusitis.  Pneumonia.  Asthma. TREATMENT  A URI goes away on its own with time. It cannot be cured with medicines, but medicines may be prescribed or recommended to relieve symptoms. Medicines may help:  Reduce your fever.  Reduce your cough.  Relieve nasal congestion. HOME CARE INSTRUCTIONS   Take medicines only as directed by your health care provider.   Gargle warm saltwater or take cough drops to comfort your throat as directed by your health care provider.  Use a warm mist humidifier or inhale steam from a shower to increase air moisture. This may make it easier to breathe.  Drink enough fluid to keep your urine clear or pale yellow.   Eat soups and other clear broths and maintain good nutrition.   Rest as needed.   Return to work when your temperature has returned to normal or as your health care provider advises. You may need to stay home longer to avoid infecting others. You can also use a face mask and careful  hand washing to prevent spread of the virus.  Increase the usage of your inhaler if you have asthma.   Do not use any tobacco products, including cigarettes, chewing tobacco, or electronic cigarettes. If you need help quitting, ask your health care provider. PREVENTION  The best way to protect yourself from getting a cold is to practice good hygiene.   Avoid oral or hand contact with people with cold symptoms.   Wash your hands often if contact occurs.  There is no clear evidence that vitamin C, vitamin E, echinacea, or exercise reduces the chance  of developing a cold. However, it is always recommended to get plenty of rest, exercise, and practice good nutrition.  SEEK MEDICAL CARE IF:   You are getting worse rather than better.   Your symptoms are not controlled by medicine.   You have chills.  You have worsening shortness of breath.  You have brown or red mucus.  You have yellow or brown nasal discharge.  You have pain in your face, especially when you bend forward.  You have a fever.  You have swollen neck glands.  You have pain while swallowing.  You have white areas in the back of your throat. SEEK IMMEDIATE MEDICAL CARE IF:   You have severe or persistent:  Headache.  Ear pain.  Sinus pain.  Chest pain.  You have chronic lung disease and any of the following:  Wheezing.  Prolonged cough.  Coughing up blood.  A change in your usual mucus.  You have a stiff neck.  You have changes in your:  Vision.  Hearing.  Thinking.  Mood. MAKE SURE YOU:   Understand these instructions.  Will watch your condition.  Will get help right away if you are not doing well or get worse.   This information is not intended to replace advice given to you by your health care provider. Make sure you discuss any questions you have with your health care provider.   Document Released: 05/03/2001 Document Revised: 03/24/2015 Document Reviewed: 02/12/2014 Elsevier Interactive Patient Education [employer]

## 2016-02-10 NOTE — Progress Notes (Signed)
Subjective:    Patient ID: Charles Rowe, male    DOB: 05-16-1971, 45 y.o.   MRN: [id number]  URI  This is a new problem. The current episode started in the past 7 days. The problem has been gradually improving. The maximum temperature recorded prior to his arrival was 100.4 - 100.9 F. The fever has been present for 1 to 2 days. Associated symptoms include congestion, coughing, ear pain, rhinorrhea, sinus pain, a sore throat and swollen glands. Pertinent negatives include no diarrhea, headaches, joint pain, joint swelling, nausea or sneezing. He has tried acetaminophen and decongestant (Mucinex) for the symptoms. The treatment provided moderate relief.      Review of Systems  Constitutional: Positive for fever and fatigue.  HENT: Positive for congestion, ear pain, postnasal drip, rhinorrhea, sinus pressure and sore throat. Negative for ear discharge, sneezing, tinnitus and trouble swallowing.   Respiratory: Positive for cough.   Gastrointestinal: Negative for nausea and diarrhea.  Musculoskeletal: Negative for joint pain.  Neurological: Negative for headaches.   Past Medical History  Diagnosis Date  . Hyperlipidemia   . Hypertension     Social History   Social History  . Marital Status: Married    Spouse Name: N/A  . Number of Children: N/A  . Years of Education: N/A   Occupational History  . Not on file.   Social History Main Topics  . Smoking status: Current Some Day Smoker    Types: Cigarettes, Cigars  . Smokeless tobacco: Former Neurosurgeon     Comment: 1 cigarette a month  . Alcohol Use: 0.0 oz/week    0 Standard drinks or equivalent per week  . Drug Use: No  . Sexual Activity: Not on file   Other Topics Concern  . Not on file   Social History Narrative    Past Surgical History  Procedure Laterality Date  . Gallbladder surgery      Family History  Problem Relation Age of Onset  . Cancer Father     brain    No Known Allergies  Current Outpatient  Prescriptions on File Prior to Visit  Medication Sig Dispense Refill  . atorvastatin (LIPITOR) 40 MG tablet Take 1 tablet (40 mg total) by mouth daily. 90 tablet 3  . lisinopril-hydrochlorothiazide (PRINZIDE,ZESTORETIC) 20-12.5 MG tablet Take 1 tablet by mouth daily. 90 tablet 3   No current facility-administered medications on file prior to visit.    BP 133/82 mmHg  Pulse 96  Temp(Src) 98.5 F (36.9 C) (Oral)  Ht 5' 8.5" (1.74 m)  Wt 207 lb (93.895 kg)  BMI 31.01 kg/m2  SpO2 97%       Objective:   Physical Exam  Constitutional: He is oriented to person, place, and time. He appears well-developed and well-nourished. No distress.  HENT:  Head: Normocephalic and atraumatic.  Right Ear: Hearing, tympanic membrane, external ear and ear canal normal.  Left Ear: Hearing, tympanic membrane, external ear and ear canal normal.  Nose: Rhinorrhea present. Right sinus exhibits no maxillary sinus tenderness and no frontal sinus tenderness. Left sinus exhibits no maxillary sinus tenderness and no frontal sinus tenderness.  Mouth/Throat: Oropharynx is clear and moist. No oropharyngeal exudate.  Cardiovascular: Normal rate, regular rhythm, normal heart sounds and intact distal pulses.  Exam reveals no gallop and no friction rub.   No murmur heard. Pulmonary/Chest: Effort normal and breath sounds normal. No respiratory distress. He has no wheezes. He has no rales. He exhibits no tenderness.  Neurological: He is alert and  oriented to person, place, and time.  Skin: Skin is warm and dry. No rash noted. He is not diaphoretic. No erythema. No pallor.  Psychiatric: He has a normal mood and affect. His behavior is normal. Judgment and thought content normal.  Nursing note and vitals reviewed.     Assessment & Plan:  1. Acute upper respiratory infection -I will give him a paper prescription to use but he was advised not to fill the prescription for the next 24-48 hours and only if the symptoms are no  longer improving or if they have not resolved. - doxycycline (VIBRAMYCIN) 100 MG capsule; Take 1 capsule (100 mg total) by mouth 2 (two) times daily.  Dispense: 14 capsule; Refill: 0 - And T new with Mucinex and can add an over-the-counter allergy medicine such as Claritin. Also use Flonase Follow-up if no improvement

## 2016-07-20 ENCOUNTER — Other Ambulatory Visit (INDEPENDENT_AMBULATORY_CARE_PROVIDER_SITE_OTHER): Payer: 59

## 2016-07-20 DIAGNOSIS — Z0000 Encounter for general adult medical examination without abnormal findings: Secondary | ICD-10-CM

## 2016-07-20 LAB — POC URINALSYSI DIPSTICK (AUTOMATED)
Bilirubin, UA: NEGATIVE
Blood, UA: NEGATIVE
GLUCOSE UA: NEGATIVE
KETONES UA: NEGATIVE
Leukocytes, UA: NEGATIVE
Nitrite, UA: NEGATIVE
Protein, UA: NEGATIVE
SPEC GRAV UA: 1.02
Urobilinogen, UA: 0.2
pH, UA: 6

## 2016-07-20 LAB — TSH: TSH: 1.33 u[IU]/mL (ref 0.35–4.50)

## 2016-07-20 LAB — CBC WITH DIFFERENTIAL/PLATELET
BASOS PCT: 0.5 % (ref 0.0–3.0)
Basophils Absolute: 0.1 10*3/uL (ref 0.0–0.1)
EOS ABS: 0 10*3/uL (ref 0.0–0.7)
EOS PCT: 0.3 % (ref 0.0–5.0)
HEMATOCRIT: 43.4 % (ref 39.0–52.0)
Hemoglobin: 15.1 g/dL (ref 13.0–17.0)
LYMPHS ABS: 1.3 10*3/uL (ref 0.7–4.0)
LYMPHS PCT: 11 % — AB (ref 12.0–46.0)
MCHC: 34.8 g/dL (ref 30.0–36.0)
MCV: 93.5 fl (ref 78.0–100.0)
MONO ABS: 0.6 10*3/uL (ref 0.1–1.0)
MONOS PCT: 5.1 % (ref 3.0–12.0)
NEUTROS ABS: 10 10*3/uL — AB (ref 1.4–7.7)
Neutrophils Relative %: 83.1 % — ABNORMAL HIGH (ref 43.0–77.0)
PLATELETS: 251 10*3/uL (ref 150.0–400.0)
RBC: 4.64 Mil/uL (ref 4.22–5.81)
RDW: 12.5 % (ref 11.5–15.5)
WBC: 12.1 10*3/uL — ABNORMAL HIGH (ref 4.0–10.5)

## 2016-07-20 LAB — HEPATIC FUNCTION PANEL
ALK PHOS: 47 U/L (ref 39–117)
ALT: 21 U/L (ref 0–53)
AST: 19 U/L (ref 0–37)
Albumin: 4.5 g/dL (ref 3.5–5.2)
BILIRUBIN DIRECT: 0.1 mg/dL (ref 0.0–0.3)
BILIRUBIN TOTAL: 1 mg/dL (ref 0.2–1.2)
Total Protein: 7.5 g/dL (ref 6.0–8.3)

## 2016-07-20 LAB — BASIC METABOLIC PANEL
BUN: 28 mg/dL — ABNORMAL HIGH (ref 6–23)
CALCIUM: 9.4 mg/dL (ref 8.4–10.5)
CHLORIDE: 100 meq/L (ref 96–112)
CO2: 29 meq/L (ref 19–32)
Creatinine, Ser: 1.2 mg/dL (ref 0.40–1.50)
GFR: 69.1 mL/min (ref >60.00)
GLUCOSE: 101 mg/dL — AB (ref 70–99)
Potassium: 4 mEq/L (ref 3.5–5.1)
SODIUM: 138 meq/L (ref 135–145)

## 2016-07-20 LAB — LIPID PANEL
CHOL/HDL RATIO: 3
Cholesterol: 179 mg/dL (ref 0–200)
HDL: 51.4 mg/dL (ref >39.00)
LDL CALC: 107 mg/dL — AB (ref 0–99)
NonHDL: 127.58
TRIGLYCERIDES: 104 mg/dL (ref 0.0–149.0)
VLDL: 20.8 mg/dL (ref 0.0–40.0)

## 2016-07-27 ENCOUNTER — Ambulatory Visit (INDEPENDENT_AMBULATORY_CARE_PROVIDER_SITE_OTHER): Payer: 59 | Admitting: Internal Medicine

## 2016-07-27 ENCOUNTER — Encounter: Payer: Self-pay | Admitting: Internal Medicine

## 2016-07-27 VITALS — BP 124/80 | HR 91 | Temp 98.2°F | Resp 20 | Ht 68.5 in | Wt 204.5 lb

## 2016-07-27 DIAGNOSIS — Z0000 Encounter for general adult medical examination without abnormal findings: Secondary | ICD-10-CM

## 2016-07-27 MED ORDER — ATORVASTATIN CALCIUM 40 MG PO TABS: 40.0000 mg | ORAL_TABLET | Freq: Every day | ORAL | 3 refills | Status: DC

## 2016-07-27 MED ORDER — LISINOPRIL-HYDROCHLOROTHIAZIDE 20-12.5 MG PO TABS: 1.0000 | ORAL_TABLET | Freq: Every day | ORAL | 3 refills | Status: DC

## 2016-07-27 NOTE — Patient Instructions (Addendum)
Limit your sodium (Salt) intake    It is important that you exercise regularly, at least 20 minutes 3 to 4 times per week.  If you develop chest pain or shortness of breath seek  medical attention.  You need to lose weight.  Consider a lower calorie diet and regular exercise.  Return in one year for follow-up Health Maintenance, Male A healthy lifestyle and preventative care can promote health and wellness.  Maintain regular health, dental, and eye exams.  Eat a healthy diet. Foods like vegetables, fruits, whole grains, low-fat dairy products, and lean protein foods contain the nutrients you need and are low in calories. Decrease your intake of foods high in solid fats, added sugars, and salt. Get information about a proper diet from your health care provider, if necessary.  Regular physical exercise is one of the most important things you can do for your health. Most adults should get at least 150 minutes of moderate-intensity exercise (any activity that increases your heart rate and causes you to sweat) each week. In addition, most adults need muscle-strengthening exercises on 2 or more days a week.   Maintain a healthy weight. The body mass index (BMI) is a screening tool to identify possible weight problems. It provides an estimate of body fat based on height and weight. Your health care provider can find your BMI and can help you achieve or maintain a healthy weight. For males 20 years and older:  A BMI below 18.5 is considered underweight.  A BMI of 18.5 to 24.9 is normal.  A BMI of 25 to 29.9 is considered overweight.  A BMI of 30 and above is considered obese.  Maintain normal blood lipids and cholesterol by exercising and minimizing your intake of saturated fat. Eat a balanced diet with plenty of fruits and vegetables. Blood tests for lipids and cholesterol should begin at age 41 and be repeated every 5 years. If your lipid or cholesterol levels are high, you are over age 63, or  you are at high risk for heart disease, you may need your cholesterol levels checked more frequently.Ongoing high lipid and cholesterol levels should be treated with medicines if diet and exercise are not working.  If you smoke, find out from your health care provider how to quit. If you do not use tobacco, do not start.  Lung cancer screening is recommended for adults aged 55-80 years who are at high risk for developing lung cancer because of a history of smoking. A yearly low-dose CT scan of the lungs is recommended for people who have at least a 30-pack-year history of smoking and are current smokers or have quit within the past 15 years. A pack year of smoking is smoking an average of 1 pack of cigarettes a day for 1 year (for example, a 30-pack-year history of smoking could mean smoking 1 pack a day for 30 years or 2 packs a day for 15 years). Yearly screening should continue until the smoker has stopped smoking for at least 15 years. Yearly screening should be stopped for people who develop a health problem that would prevent them from having lung cancer treatment.  If you choose to drink alcohol, do not have more than 2 drinks per day. One drink is considered to be 12 oz (360 mL) of beer, 5 oz (150 mL) of wine, or 1.5 oz (45 mL) of liquor.  Avoid the use of street drugs. Do not share needles with anyone. Ask for help if you need  support or instructions about stopping the use of drugs.  High blood pressure causes heart disease and increases the risk of stroke. High blood pressure is more likely to develop in:  People who have blood pressure in the end of the normal range (100-139/85-89 mm Hg).  People who are overweight or obese.  People who are African American.  If you are 26-48 years of age, have your blood pressure checked every 3-5 years. If you are 4 years of age or older, have your blood pressure checked every year. You should have your blood pressure measured twice--once when you  are at a hospital or clinic, and once when you are not at a hospital or clinic. Record the average of the two measurements. To check your blood pressure when you are not at a hospital or clinic, you can use:  An automated blood pressure machine at a pharmacy.  A home blood pressure monitor.  If you are 78-32 years old, ask your health care provider if you should take aspirin to prevent heart disease.  Diabetes screening involves taking a blood sample to check your fasting blood sugar level. This should be done once every 3 years after age 49 if you are at a normal weight and without risk factors for diabetes. Testing should be considered at a younger age or be carried out more frequently if you are overweight and have at least 1 risk factor for diabetes.  Colorectal cancer can be detected and often prevented. Most routine colorectal cancer screening begins at the age of 3 and continues through age 48. However, your health care provider may recommend screening at an earlier age if you have risk factors for colon cancer. On a yearly basis, your health care provider may provide home test kits to check for hidden blood in the stool. A small camera at the end of a tube may be used to directly examine the colon (sigmoidoscopy or colonoscopy) to detect the earliest forms of colorectal cancer. Talk to your health care provider about this at age 22 when routine screening begins. A direct exam of the colon should be repeated every 5-10 years through age 62, unless early forms of precancerous polyps or small growths are found.  People who are at an increased risk for hepatitis B should be screened for this virus. You are considered at high risk for hepatitis B if:  You were born in a country where hepatitis B occurs often. Talk with your health care provider about which countries are considered high risk.  Your parents were born in a high-risk country and you have not received a shot to protect against  hepatitis B (hepatitis B vaccine).  You have HIV or AIDS.  You use needles to inject street drugs.  You live with, or have sex with, someone who has hepatitis B.  You are a man who has sex with other men (MSM).  You get hemodialysis treatment.  You take certain medicines for conditions like cancer, organ transplantation, and autoimmune conditions.  Hepatitis C blood testing is recommended for all people born from [birth year] through 1966 and any individual with known risk factors for hepatitis C.  Healthy men should no longer receive prostate-specific antigen (PSA) blood tests as part of routine cancer screening. Talk to your health care provider about prostate cancer screening.  Testicular cancer screening is not recommended for adolescents or adult males who have no symptoms. Screening includes self-exam, a health care provider exam, and other screening tests. Consult with your  health care provider about any symptoms you have or any concerns you have about testicular cancer.  Practice safe sex. Use condoms and avoid high-risk sexual practices to reduce the spread of sexually transmitted infections (STIs).  You should be screened for STIs, including gonorrhea and chlamydia if:  You are sexually active and are younger than 24 years.  You are older than 24 years, and your health care provider tells you that you are at risk for this type of infection.  Your sexual activity has changed since you were last screened, and you are at an increased risk for chlamydia or gonorrhea. Ask your health care provider if you are at risk.  If you are at risk of being infected with HIV, it is recommended that you take a prescription medicine daily to prevent HIV infection. This is called pre-exposure prophylaxis (PrEP). You are considered at risk if:  You are a man who has sex with other men (MSM).  You are a heterosexual man who is sexually active with multiple partners.  You take drugs by  injection.  You are sexually active with a partner who has HIV.  Talk with your health care provider about whether you are at high risk of being infected with HIV. If you choose to begin PrEP, you should first be tested for HIV. You should then be tested every 3 months for as long as you are taking PrEP.  Use sunscreen. Apply sunscreen liberally and repeatedly throughout the day. You should seek shade when your shadow is shorter than you. Protect yourself by wearing long sleeves, pants, a wide-brimmed hat, and sunglasses year round whenever you are outdoors.  Tell your health care provider of new moles or changes in moles, especially if there is a change in shape or color. Also, tell your health care provider if a mole is larger than the size of a pencil eraser.  A one-time screening for abdominal aortic aneurysm (AAA) and surgical repair of large AAAs by ultrasound is recommended for men aged 65-75 years who are current or former smokers.  Stay current with your vaccines (immunizations).   This information is not intended to replace advice given to you by your health care provider. Make sure you discuss any questions you have with your health care provider.   Document Released: 05/05/2008 Document Revised: 11/28/2014 Document Reviewed: 04/04/2011 Elsevier Interactive Patient Education 2016 Elsevier Inc.  Fat and Cholesterol Restricted Diet Getting too much fat and cholesterol in your diet may cause health problems. Following this diet helps keep your fat and cholesterol at normal levels. This can keep you from getting sick. WHAT TYPES OF FAT SHOULD I CHOOSE?  Choose monosaturated and polyunsaturated fats. These are found in foods such as olive oil, canola oil, flaxseeds, walnuts, almonds, and seeds.  Eat more omega-3 fats. Good choices include salmon, mackerel, sardines, tuna, flaxseed oil, and ground flaxseeds.  Limit saturated fats. These are in animal products such as meats, butter,  and cream. They can also be in plant products such as palm oil, palm kernel oil, and coconut oil.   Avoid foods with partially hydrogenated oils in them. These contain trans fats. Examples of foods that have trans fats are stick margarine, some tub margarines, cookies, crackers, and other baked goods. WHAT GENERAL GUIDELINES DO I NEED TO FOLLOW?   Check food labels. Look for the words "trans fat" and "saturated fat."  When preparing a meal:  Fill half of your plate with vegetables and green salads.  Fill  one fourth of your plate with whole grains. Look for the word "whole" as the first word in the ingredient list.  Fill one fourth of your plate with lean protein foods.  Limit fruit to two servings a day. Choose fruit instead of juice.  Eat more foods with soluble fiber. Examples of foods with this type of fiber are apples, broccoli, carrots, beans, peas, and barley. Try to get 20-30 g (grams) of fiber per day.  Eat more home-cooked foods. Eat less at restaurants and buffets.  Limit or avoid alcohol.  Limit foods high in starch and sugar.  Limit fried foods.  Cook foods without frying them. Baking, boiling, grilling, and broiling are all great options.  Lose weight if you are overweight. Losing even a small amount of weight can help your overall health. It can also help prevent diseases such as diabetes and heart disease. WHAT FOODS CAN I EAT? Grains Whole grains, such as whole wheat or whole grain breads, crackers, cereals, and pasta. Unsweetened oatmeal, bulgur, barley, quinoa, or brown rice. Corn or whole wheat flour tortillas. Vegetables Fresh or frozen vegetables (raw, steamed, roasted, or grilled). Green salads. Fruits All fresh, canned (in natural juice), or frozen fruits. Meat and Other Protein Products Ground beef (85% or leaner), grass-fed beef, or beef trimmed of fat. Skinless chicken or [place]. Ground chicken or [place]. Pork trimmed of fat. All fish and seafood. Eggs.  Dried beans, peas, or lentils. Unsalted nuts or seeds. Unsalted canned or dry beans. Dairy Low-fat dairy products, such as skim or 1% milk, 2% or reduced-fat cheeses, low-fat ricotta or cottage cheese, or plain low-fat yogurt. Fats and Oils Tub margarines without trans fats. Light or reduced-fat mayonnaise and salad dressings. Avocado. Olive, canola, sesame, or safflower oils. Natural peanut or almond butter (choose ones without added sugar and oil). The items listed above may not be a complete list of recommended foods or beverages. Contact your dietitian for more options. WHAT FOODS ARE NOT RECOMMENDED? Grains White bread. White pasta. White rice. Cornbread. Bagels, pastries, and croissants. Crackers that contain trans fat. Vegetables White potatoes. Corn. Creamed or fried vegetables. Vegetables in a cheese sauce. Fruits Dried fruits. Canned fruit in light or heavy syrup. Fruit juice. Meat and Other Protein Products Fatty cuts of meat. Ribs, chicken wings, bacon, sausage, bologna, salami, chitterlings, fatback, hot dogs, bratwurst, and packaged luncheon meats. Liver and organ meats. Dairy Whole or 2% milk, cream, half-and-half, and cream cheese. Whole milk cheeses. Whole-fat or sweetened yogurt. Full-fat cheeses. Nondairy creamers and whipped toppings. Processed cheese, cheese spreads, or cheese curds. Sweets and Desserts Corn syrup, sugars, honey, and molasses. Candy. Jam and jelly. Syrup. Sweetened cereals. Cookies, pies, cakes, donuts, muffins, and ice cream. Fats and Oils Butter, stick margarine, lard, shortening, ghee, or bacon fat. Coconut, palm kernel, or palm oils. Beverages Alcohol. Sweetened drinks (such as sodas, lemonade, and fruit drinks or punches). The items listed above may not be a complete list of foods and beverages to avoid. Contact your dietitian for more information.   This information is not intended to replace advice given to you by your health care provider. Make  sure you discuss any questions you have with your health care provider.   Document Released: 05/08/2012 Document Revised: 11/28/2014 Document Reviewed: 02/06/2014 Elsevier Interactive Patient Education [employer]2016 Elsevier Inc.

## 2016-07-27 NOTE — Progress Notes (Signed)
Pre visit review using our clinic review tool, if applicable. No additional management support is needed unless otherwise documented below in the visit note. 

## 2016-07-27 NOTE — Progress Notes (Signed)
Subjective:    Patient ID: Charles Rowe, male    DOB: [birth year], 45 y.o.   MRN: [id number]  HPI    Subjective:    Patient ID: Charles Betternthony Tokunaga, male    DOB: [birth year], 45 y.o.   MRN: [id number]  HPI Pre-visit discussion using our clinic review tool. No additional management support is needed unless otherwise documented below in the visit note.  45  year-old patient who is seen today for a health maintenance exam.  He does monitor occasional home blood pressures with nice results.  No concerns or complaints today     BP Readings from Last 3 Encounters:  07/27/16 124/80  02/10/16 133/82  10/19/15 136/88    Alcohol-Tobacco  Smoking Status: never  Caffeine-Diet-Exercise  Does Patient Exercise: yes   Allergies (verified):  No Known Drug Allergies   Past History:  Past Medical History:  Hyperlipidemia  overweight   Family History:   father died age 46, history of coronary artery disease, status post CABG at 6258; complications of lung cancer. Also history of prostate cancer At age 45. mother at 6766 of breast cancer survivor. 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  HENT: Negative for congestion, dental problem, ear pain, hearing loss, mouth sores, rhinorrhea, sinus pressure, sneezing, tinnitus, trouble swallowing and voice change.   Eyes: Negative for photophobia, pain, redness and visual disturbance.  Respiratory: Negative for apnea, cough, choking, chest tightness, shortness of breath and wheezing.   Cardiovascular: Negative for chest pain, palpitations and leg swelling.  Gastrointestinal: Negative for  nausea, vomiting, abdominal pain, diarrhea, constipation, blood in stool, abdominal distention, anal bleeding and rectal pain.  Genitourinary: Negative for dysuria, urgency, frequency, hematuria, flank pain, decreased urine volume, discharge, penile swelling, scrotal swelling, difficulty urinating, genital sores and testicular pain.  Musculoskeletal: Negative for arthralgias, back pain, gait problem, joint swelling, myalgias, neck pain and neck stiffness.  Skin: Negative for color change, rash and wound.  Neurological: Negative for dizziness, tremors, seizures, syncope, facial asymmetry, speech difficulty, weakness, light-headedness, numbness and headaches.  Hematological: Negative for adenopathy. Does not bruise/bleed easily.  Psychiatric/Behavioral: Negative for suicidal ideas, hallucinations, behavioral problems, confusion, sleep disturbance, self-injury, dysphoric mood, decreased concentration and agitation. The patient is not nervous/anxious.        Objective:   Physical Exam  Constitutional: He appears well-developed and well-nourished.  HENT:  Head: Normocephalic and atraumatic.  Right Ear: External ear normal.  Left Ear: External ear normal.  Nose: Nose normal.  Mouth/Throat: Oropharynx is clear and moist.  Eyes: Conjunctivae and EOM are normal. Pupils are equal, round, and reactive to light. No scleral icterus.  Neck: Normal range of motion. Neck supple. No JVD present. No thyromegaly present.  Cardiovascular: Regular rhythm, normal heart sounds and intact distal pulses.  Exam reveals no gallop and no friction rub.   No murmur heard. Pulmonary/Chest: Effort normal and breath sounds normal. He exhibits no tenderness.  Abdominal: Soft. Bowel sounds are normal. He exhibits no distension and no mass. There is no tenderness.  Genitourinary: Penis normal.  Musculoskeletal: Normal range of motion. He exhibits no edema and no tenderness.  Lymphadenopathy:    He  has no cervical  adenopathy.  Neurological: He is alert. He has normal reflexes. No cranial nerve deficit. Coordination normal.  Skin: Skin is warm and dry. No rash noted.  Psychiatric: He has a normal mood and affect. His behavior is normal.          Assessment & Plan:   Preventive health exam Dyslipidemia Hypertension.Review of Systems  Gastrointestinal: Positive for blood in stool.       Occasional bright red blood noted on tissue paper      as above Objective:   Physical Exam  Genitourinary: Rectal exam shows guaiac negative stool.    As above      Assessment & Plan:   Preventive health examination Hypertension, well-controlled.  Will continue home blood pressure monitoring.  Will continue efforts at weight loss and active exercise regimen. Recheck one year   Rogelia Boga

## 2016-12-23 ENCOUNTER — Encounter: Payer: Self-pay | Admitting: Internal Medicine

## 2016-12-23 ENCOUNTER — Ambulatory Visit (INDEPENDENT_AMBULATORY_CARE_PROVIDER_SITE_OTHER): Payer: 59 | Admitting: Internal Medicine

## 2016-12-23 VITALS — BP 100/60 | HR 78 | Temp 98.1°F | Ht 68.0 in | Wt 200.6 lb

## 2016-12-23 DIAGNOSIS — I10 Essential (primary) hypertension: Secondary | ICD-10-CM | POA: Diagnosis not present

## 2016-12-23 MED ORDER — LISINOPRIL 20 MG PO TABS
20.0000 mg | ORAL_TABLET | Freq: Every day | ORAL | 3 refills | Status: DC
Start: 2016-12-23 — End: 2018-02-17

## 2016-12-23 MED ORDER — SILDENAFIL CITRATE 20 MG PO TABS: ORAL_TABLET | ORAL | 2 refills | Status: DC

## 2016-12-23 NOTE — Progress Notes (Signed)
   Subjective:    Patient ID: Charles Rowe, male    DOB: May 14, 1971, 46 y.o.   MRN: [id number]  HPI    Review of Systems     Objective:   Physical Exam        Assessment & Plan:

## 2016-12-23 NOTE — Progress Notes (Signed)
   Subjective:    Patient ID: Charles Rowe, male    DOB: [birth year], 46 y.o.   MRN: [id number]  HPI  46 year old patient who has essential hypertension.  Medical regimen includes hydrochlorothiazide.  He presents today with a chief complaint of intermittent ED issues.  He does consume a fair amount of alcoholic beverages with dinner and after.  He feels that his libido is normal.  Past Medical History:  Diagnosis Date  . Hyperlipidemia   . Hypertension      Social History   Social History  . Marital status: Married    Spouse name: N/A  . Number of children: N/A  . Years of education: N/A   Occupational History  . Not on file.   Social History Main Topics  . Smoking status: Current Some Day Smoker    Types: Cigarettes, Cigars  . 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 Alcohol use 0.0 oz/week  . Drug use: No  . Sexual activity: Not on file   Other Topics Concern  . Not on file   Social History Narrative  . No narrative on file    Past Surgical History:  Procedure Laterality Date  . GALLBLADDER SURGERY      Family History  Problem Relation Age of Onset  . Cancer Father     brain    No Known Allergies  Current Outpatient Prescriptions on File Prior to Visit  Medication Sig Dispense Refill  . atorvastatin (LIPITOR) 40 MG tablet Take 1 tablet (40 mg total) by mouth daily. 90 tablet 3   No current facility-administered medications on file prior to visit.     BP 100/60 (BP Location: Left Arm, Patient Position: Sitting, Cuff Size: Normal)   Pulse 78   Temp 98.1 F (36.7 C) (Oral)   Ht 5\' 8"  (1.727 m)   Wt 200 lb 9.6 oz (91 kg)   BMI 30.50 kg/m     Review of Systems  Constitutional: Negative for appetite change, chills, fatigue and fever.  HENT: Negative for congestion, dental problem, ear pain, hearing loss, sore throat, tinnitus, trouble swallowing and voice change.   Eyes: Negative for pain, discharge and visual  disturbance.  Respiratory: Negative for cough, chest tightness, wheezing and stridor.   Cardiovascular: Negative for chest pain, palpitations and leg swelling.  Gastrointestinal: Negative for abdominal distention, abdominal pain, blood in stool, constipation, diarrhea, nausea and vomiting.  Genitourinary: Negative for difficulty urinating, discharge, flank pain, genital sores, hematuria and urgency.  Musculoskeletal: Negative for arthralgias, back pain, gait problem, joint swelling, myalgias and neck stiffness.  Skin: Negative for rash.  Neurological: Negative for dizziness, syncope, speech difficulty, weakness, numbness and headaches.  Hematological: Negative for adenopathy. Does not bruise/bleed easily.  Psychiatric/Behavioral: Negative for behavioral problems and dysphoric mood. The patient is not nervous/anxious.        Objective:   Physical Exam  Constitutional: He appears well-developed and well-nourished. No distress.  Blood pressure as low as 100 over 60 Weight 200          Assessment & Plan:   Episodic erectile dysfunction Essential hypertension.  Blood pressure is low normal.  Will discontinue hydrochlorothiazide and continue lisinopril alone  Patient will moderate or eliminate alcoholic beverage Trial Viagra  Consider testosterone level.  If ineffective  Rogelia BogaKWIATKOWSKI,Charles Rowe

## 2016-12-23 NOTE — Progress Notes (Signed)
Pre visit review using our clinic review tool, if applicable. No additional management support is needed unless otherwise documented below in the visit note. 

## 2016-12-23 NOTE — Patient Instructions (Signed)
Limit your sodium (Salt) intake    It is important that you exercise regularly, at least 20 minutes 3 to 4 times per week.  If you develop chest pain or shortness of breath seek  medical attention.  Please check your blood pressure on a regular basis.  If it is consistently greater than 150/90, please make an office appointment.  

## 2017-07-25 ENCOUNTER — Encounter: Payer: Self-pay | Admitting: Internal Medicine

## 2017-07-25 ENCOUNTER — Other Ambulatory Visit (INDEPENDENT_AMBULATORY_CARE_PROVIDER_SITE_OTHER): Payer: 59 | Admitting: Internal Medicine

## 2017-07-25 VITALS — BP 122/72 | HR 63 | Temp 97.9°F | Ht 68.0 in | Wt 190.8 lb

## 2017-07-25 DIAGNOSIS — Z0000 Encounter for general adult medical examination without abnormal findings: Secondary | ICD-10-CM

## 2017-07-25 DIAGNOSIS — I10 Essential (primary) hypertension: Secondary | ICD-10-CM | POA: Diagnosis not present

## 2017-07-25 DIAGNOSIS — E785 Hyperlipidemia, unspecified: Secondary | ICD-10-CM

## 2017-07-25 LAB — CBC
HCT: 43.7 % (ref 39.0–52.0)
Hemoglobin: 14.5 g/dL (ref 13.0–17.0)
MCHC: 33.2 g/dL (ref 30.0–36.0)
MCV: 97.1 fl (ref 78.0–100.0)
PLATELETS: 233 10*3/uL (ref 150.0–400.0)
RBC: 4.5 Mil/uL (ref 4.22–5.81)
RDW: 12.9 % (ref 11.5–15.5)
WBC: 6.1 10*3/uL (ref 4.0–10.5)

## 2017-07-25 LAB — LIPID PANEL
CHOL/HDL RATIO: 3
Cholesterol: 169 mg/dL (ref 0–200)
HDL: 55.4 mg/dL (ref >39.00)
LDL CALC: 102 mg/dL — AB (ref 0–99)
NONHDL: 113.37
Triglycerides: 55 mg/dL (ref 0.0–149.0)
VLDL: 11 mg/dL (ref 0.0–40.0)

## 2017-07-25 LAB — BASIC METABOLIC PANEL
BUN: 22 mg/dL (ref 6–23)
CHLORIDE: 105 meq/L (ref 96–112)
CO2: 28 mEq/L (ref 19–32)
Calcium: 9.7 mg/dL (ref 8.4–10.5)
Creatinine, Ser: 0.93 mg/dL (ref 0.40–1.50)
GFR: 92.33 mL/min (ref >60.00)
GLUCOSE: 120 mg/dL — AB (ref 70–99)
POTASSIUM: 4.5 meq/L (ref 3.5–5.1)
Sodium: 142 mEq/L (ref 135–145)

## 2017-07-25 LAB — TSH: TSH: 1.52 u[IU]/mL (ref 0.35–4.50)

## 2017-07-25 LAB — PSA: PSA: 0.51 ng/mL (ref 0.10–4.00)

## 2017-07-25 NOTE — Patient Instructions (Signed)
Limit your sodium (Salt) intake  Please check your blood pressure on a regular basis.  If it is consistently greater than 150/90, please make an office appointment.    It is important that you exercise regularly, at least 20 minutes 3 to 4 times per week.  If you develop chest pain or shortness of breath seek  medical attention.  Schedule your colonoscopy to help detect colon cancer. 

## 2017-07-25 NOTE — Progress Notes (Signed)
Subjective:    Patient ID: Charles Rowe, male    DOB: [birth year], 46 y.o.   MRN: [id number]  HPI  Wt Readings from Last 3 Encounters:  07/25/17 190 lb 12.8 oz (86.5 kg)  12/23/16 200 lb 9.6 oz (91 kg)  07/27/16 204 lb 8 oz (7192.738 kg)   46 year old patient who is seen today for a preventive health examination.  He has a history of essential hypertension as well as mild dyslipidemia.  Medical regimen includes lisinopril and atorvastatin No cardiopulmonary complaints  Patient has worked hard  the past year with a more rigorous exercise program with weight loss  Family History:   father died age 47, history of coronary artery disease, status post CABG at 8058; complications of lung cancer. Also history of prostate cancer At age 47. mother at 7368 of breast cancer survivor. 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  HENT: Negative for congestion, dental problem, ear pain, hearing loss, sore throat, tinnitus, trouble swallowing and voice change.   Eyes: Negative for pain, discharge and visual disturbance.  Respiratory: Negative for cough, chest tightness, wheezing and stridor.   Cardiovascular: Negative for chest pain, palpitations and leg swelling.  Gastrointestinal: Positive for blood in stool. Negative for abdominal distention, abdominal pain, constipation, diarrhea, nausea and vomiting.       Occasional bright red rectal bleeding on tissue paper  Genitourinary: Negative for difficulty urinating, discharge, flank pain, genital sores, hematuria and urgency.  Musculoskeletal: Negative for arthralgias, back pain, gait problem, joint  swelling, myalgias and neck stiffness.  Skin: Negative for rash.  Neurological: Negative for dizziness, syncope, speech difficulty, weakness, numbness and headaches.  Hematological: Negative for adenopathy. Does not bruise/bleed easily.  Psychiatric/Behavioral: Negative for behavioral problems and dysphoric mood. The patient is not nervous/anxious.        Objective:   Physical Exam  Constitutional: He appears well-developed and well-nourished.  HENT:  Head: Normocephalic and atraumatic.  Right Ear: External ear normal.  Left Ear: External ear normal.  Nose: Nose normal.  Mouth/Throat: Oropharynx is clear and moist.  Eyes: Pupils are equal, round, and reactive to light. Conjunctivae and EOM are normal. No scleral icterus.  Neck: Normal range of motion. Neck supple. No JVD present. No thyromegaly present.  Cardiovascular: Regular rhythm, normal heart sounds and intact distal pulses.  Exam reveals no gallop and no friction rub.   No murmur heard. Pulmonary/Chest: Effort normal and breath sounds normal. He exhibits no tenderness.  Abdominal: Soft. Bowel sounds are normal. He exhibits no distension and no mass. There is no tenderness.  Genitourinary: Prostate normal and penis normal. Rectal exam shows guaiac positive stool.  Musculoskeletal: Normal range of motion. He exhibits no edema or tenderness.  Lymphadenopathy:    He has no cervical adenopathy.  Neurological: He is alert. He has normal reflexes. No cranial nerve deficit. Coordination normal.  Skin: Skin is warm and dry. No rash noted.  Psychiatric: He has a normal mood and affect. His behavior is normal.          Assessment & Plan:   Preventive health examination Essential hypertension, well-controlled Dyslipidemia.  Will continue statin therapy and review a lipid  profile Hematest positive stool.  Probably secondary to internal hemorrhoid.  Will schedule screening colonoscopy  Review screening lab Medications  refilled Follow-up one year or as needed  Rogelia Boga

## 2017-07-27 ENCOUNTER — Other Ambulatory Visit: Payer: Self-pay | Admitting: Internal Medicine

## 2017-07-27 DIAGNOSIS — Z0000 Encounter for general adult medical examination without abnormal findings: Secondary | ICD-10-CM

## 2017-07-27 NOTE — Telephone Encounter (Signed)
Please advise 

## 2017-07-27 NOTE — Telephone Encounter (Signed)
Noted. Notify patient to go to the [place] at [facility] for the required chest x-ray And to drop by the office for the note and chest x-ray report

## 2017-07-27 NOTE — Telephone Encounter (Signed)
Left message on voicemail to call office.  

## 2017-07-27 NOTE — Telephone Encounter (Signed)
Pt need a certificate for good health and full chest posterior and exterior xray to show that he does not have TB this is [place] and this is a requirement for him to go.

## 2017-07-28 ENCOUNTER — Other Ambulatory Visit: Payer: Self-pay | Admitting: Internal Medicine

## 2017-07-28 ENCOUNTER — Ambulatory Visit (INDEPENDENT_AMBULATORY_CARE_PROVIDER_SITE_OTHER)
Admission: RE | Admit: 2017-07-28 | Discharge: 2017-07-28 | Disposition: A | Discharge status: Home / Self Care | Payer: 59 | Source: Ambulatory Visit | Attending: Internal Medicine | Admitting: Internal Medicine

## 2017-07-28 DIAGNOSIS — Z0000 Encounter for general adult medical examination without abnormal findings: Secondary | ICD-10-CM

## 2017-07-28 NOTE — Telephone Encounter (Signed)
Pt stopped by the office to pick up letter. He will go get the chest xray done today.

## 2017-07-31 ENCOUNTER — Encounter: Payer: 59 | Admitting: Internal Medicine

## 2017-08-12 ENCOUNTER — Other Ambulatory Visit: Payer: Self-pay | Admitting: Internal Medicine

## 2017-09-25 ENCOUNTER — Encounter: Payer: Self-pay | Admitting: Internal Medicine

## 2018-02-10 ENCOUNTER — Other Ambulatory Visit: Payer: Self-pay | Admitting: Internal Medicine

## 2018-02-17 ENCOUNTER — Other Ambulatory Visit: Payer: Self-pay | Admitting: Internal Medicine

## 2018-05-02 ENCOUNTER — Telehealth: Payer: Self-pay | Admitting: Internal Medicine

## 2018-05-02 NOTE — Telephone Encounter (Signed)
Patient phoned requesting most recent cholesterol numbers Reviewed with patient.

## 2018-05-03 IMAGING — DX DG CHEST 2V
3 series · 3 of 3 positions shown · non-contrast
Comparison: None.

CLINICAL DATA: Health care maintenance.

EXAM:
CHEST  2 VIEW

[chest pa]
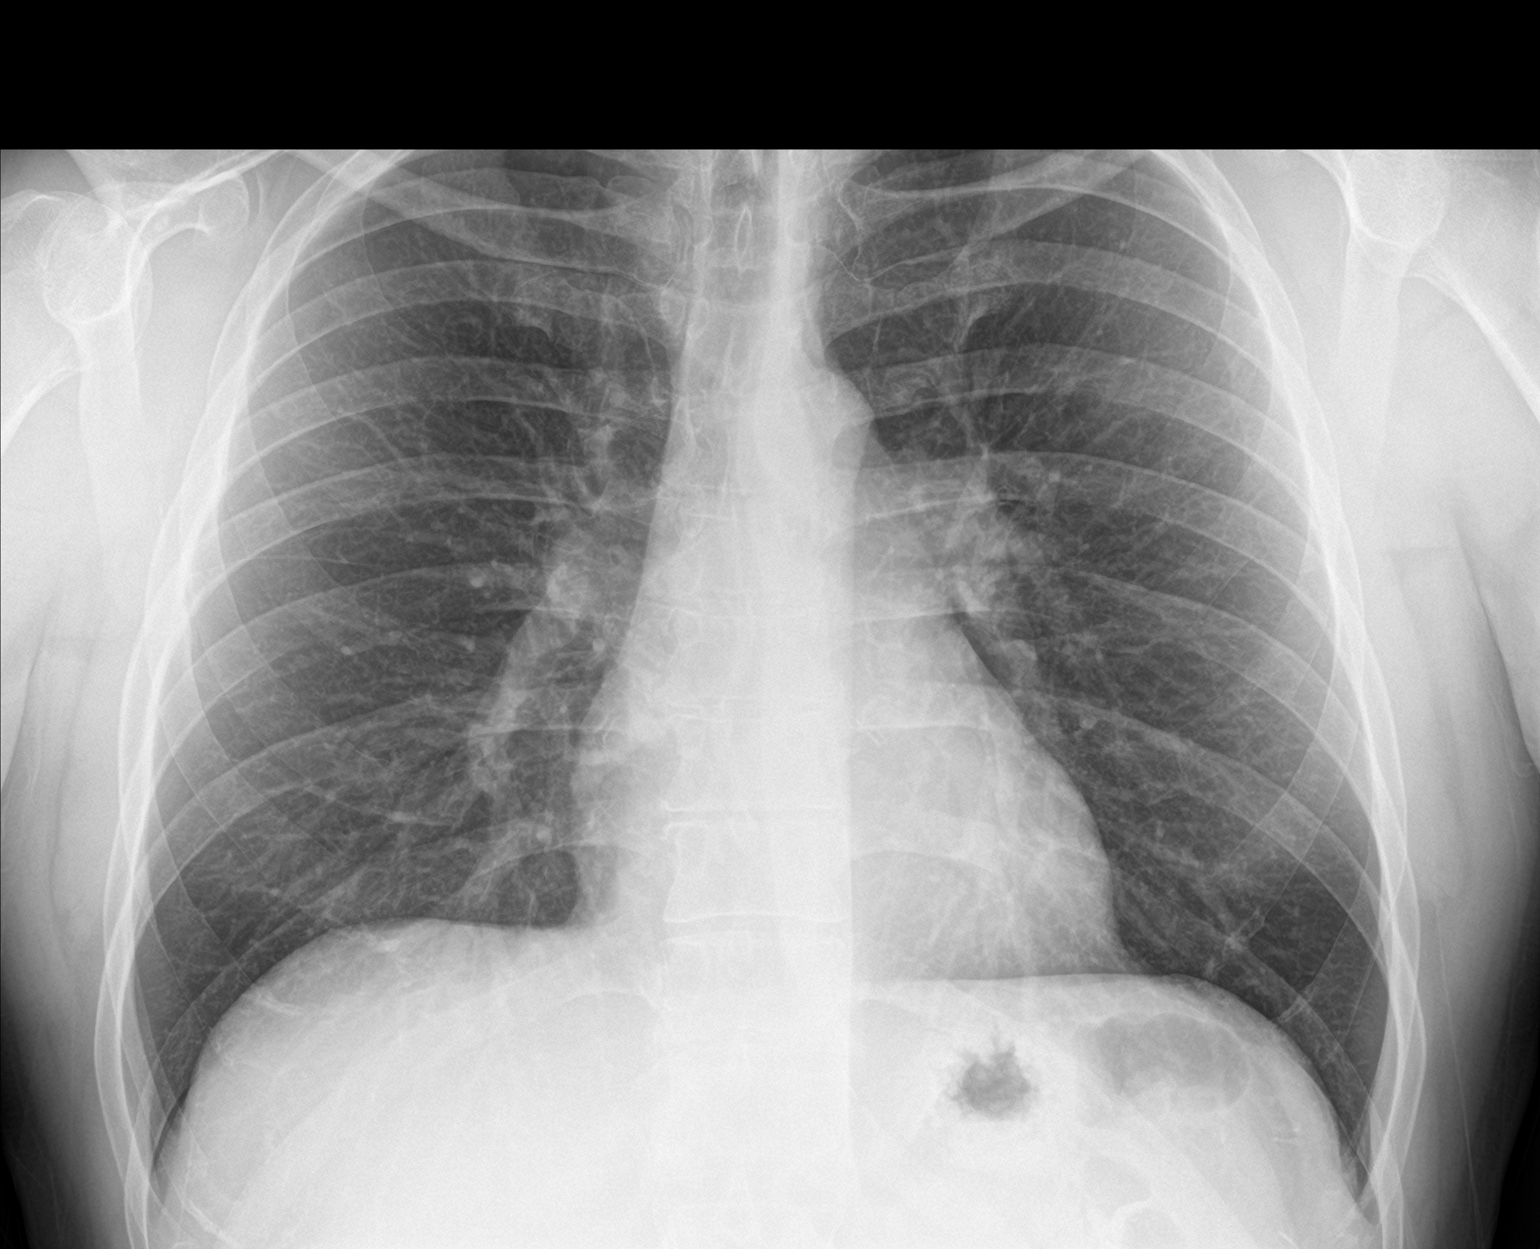

[chest lat]
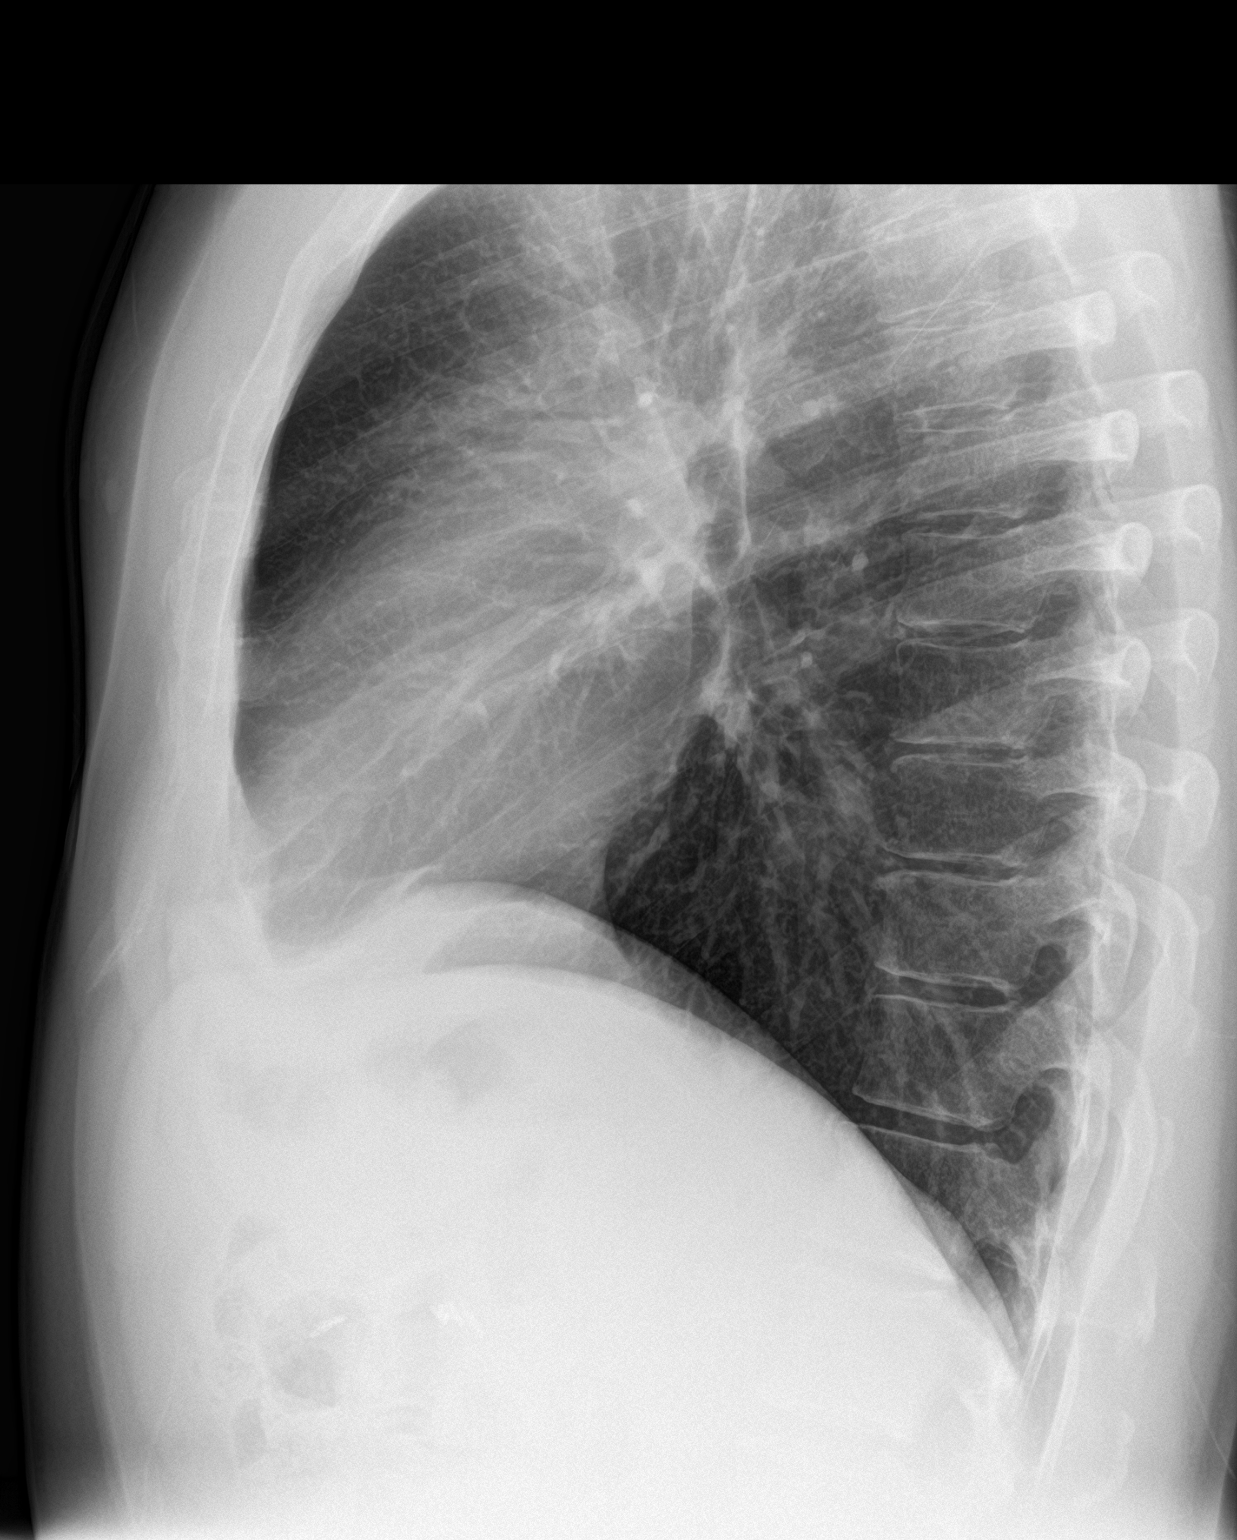

[chest ap]
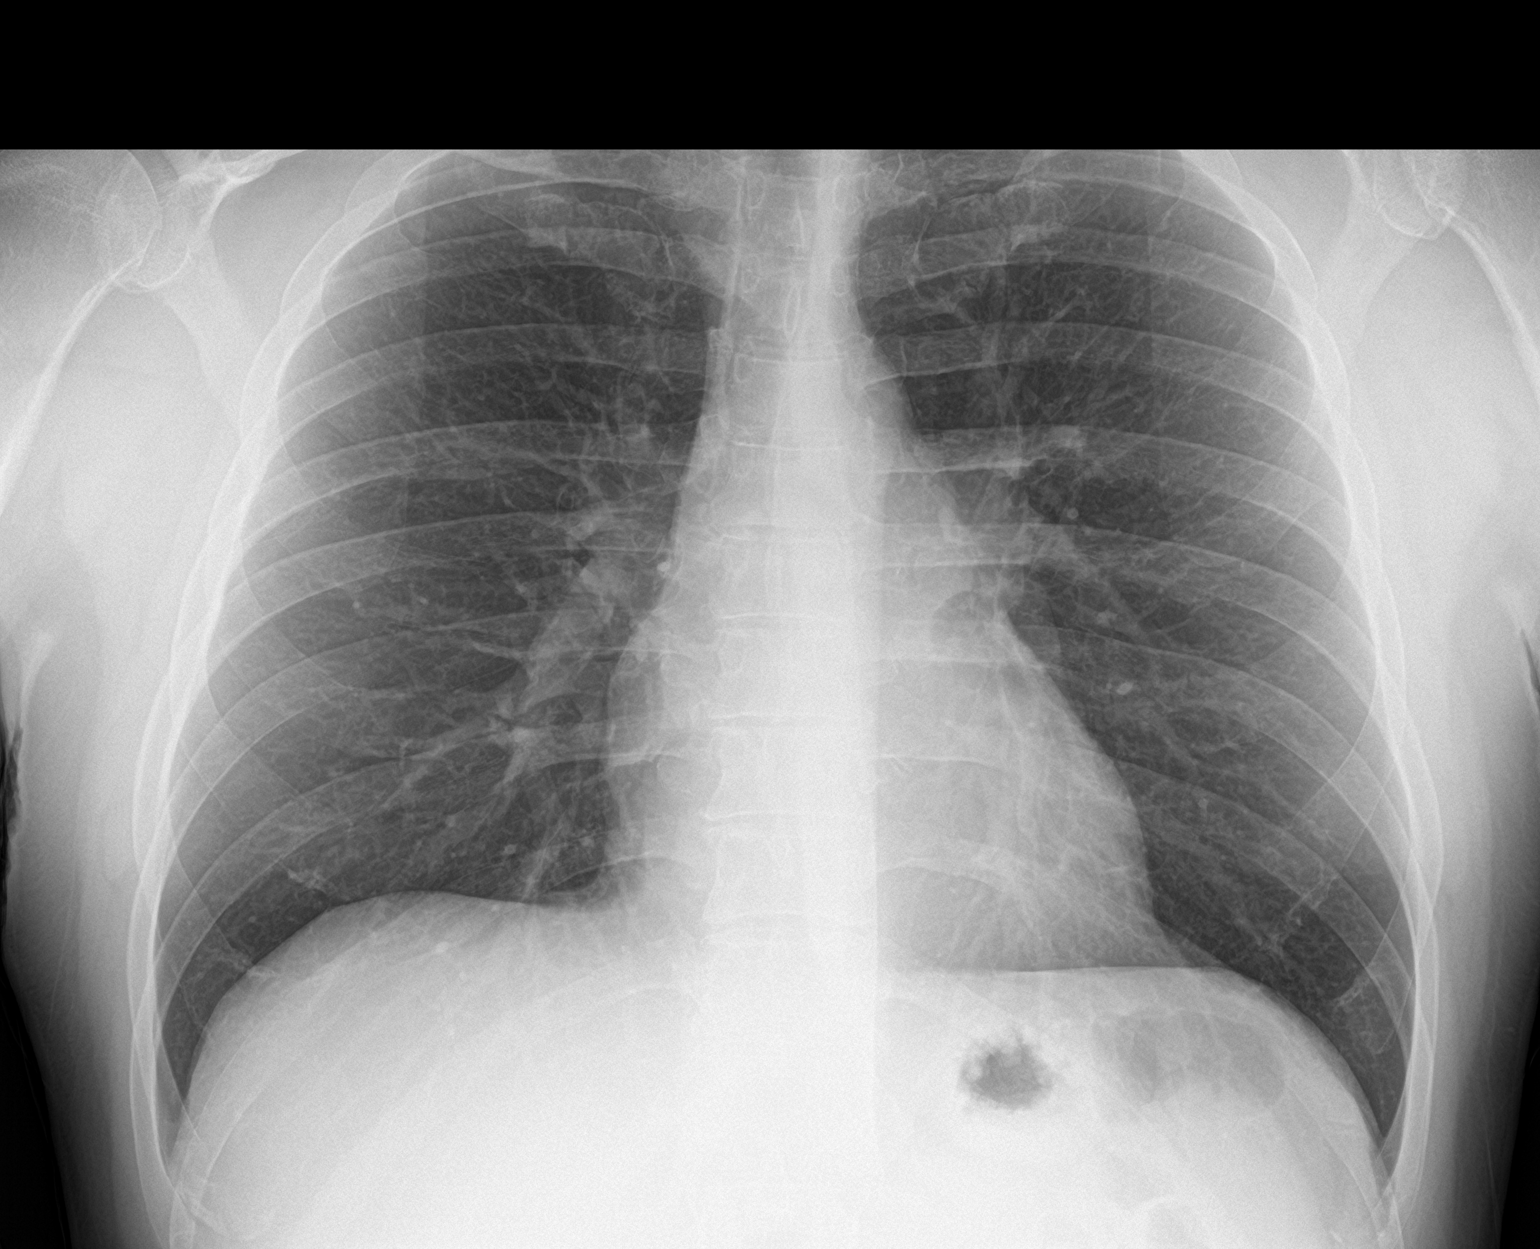

[3 of 3 positions shown; findings below may reference images not displayed]

FINDINGS: The heart size and mediastinal contours are within normal limits.
Both lungs are clear. The visualized skeletal structures are
unremarkable.
IMPRESSION: No active cardiopulmonary disease.

## 2018-05-19 ENCOUNTER — Telehealth: Payer: Self-pay | Admitting: Internal Medicine

## 2018-05-21 NOTE — Telephone Encounter (Signed)
Patient need to schedule an ov for more refills. 

## 2018-05-23 NOTE — Telephone Encounter (Signed)
Patient called and states he no longer need the refill. [place]

## 2018-05-23 NOTE — Telephone Encounter (Signed)
FYI

## 2018-05-25 NOTE — Telephone Encounter (Signed)
Noted  

## 2018-07-26 ENCOUNTER — Encounter: Payer: Self-pay | Admitting: Internal Medicine

## 2019-05-21 ENCOUNTER — Encounter: Payer: Self-pay | Admitting: Family Medicine

## 2019-07-23 ENCOUNTER — Ambulatory Visit (INDEPENDENT_AMBULATORY_CARE_PROVIDER_SITE_OTHER): Payer: 59 | Admitting: Family Medicine

## 2019-07-23 ENCOUNTER — Other Ambulatory Visit: Payer: Self-pay

## 2019-07-23 ENCOUNTER — Encounter: Payer: Self-pay | Admitting: Family Medicine

## 2019-07-23 VITALS — BP 122/72 | HR 70 | Temp 97.9°F | Resp 12 | Ht 68.0 in | Wt 206.5 lb

## 2019-07-23 DIAGNOSIS — E785 Hyperlipidemia, unspecified: Secondary | ICD-10-CM

## 2019-07-23 DIAGNOSIS — I10 Essential (primary) hypertension: Secondary | ICD-10-CM

## 2019-07-23 DIAGNOSIS — Z6831 Body mass index (BMI) 31.0-31.9, adult: Secondary | ICD-10-CM | POA: Diagnosis not present

## 2019-07-23 DIAGNOSIS — E669 Obesity, unspecified: Secondary | ICD-10-CM

## 2019-07-23 NOTE — Patient Instructions (Addendum)
A few things to remember from today's visit:   Dyslipidemia  Essential hypertension, benign  BMI 31.0-31.9,adult  Blood pressure goal for most people is less than 140/90. Some populations (older than 60) the goal is less than 150/90.  Most recent cardiologists' recommendations recommend blood pressure at or less than 130/80.   Elevated blood pressure increases the risk of strokes, heart and kidney disease, and eye problems. Regular physical activity and a healthy diet (DASH diet) usually help. Low salt diet. Take medications as instructed.  Caution with some over the counter medications as cold medications, dietary products (for weight loss), and Ibuprofen or Aleve (frequent use);all these medications could cause elevation of blood pressure.  As soon as I have copy of your last labs and CPE, I will be glad to write you a letter for life insurance.   Please be sure medication list is accurate. If a new problem present, please set up appointment sooner than planned today.

## 2019-07-23 NOTE — Progress Notes (Signed)
HPI:   [name] is a 48 y.o. male, who is here today to establish care.  Former PCP: Dr. Burnice Logan Last preventive routine visit: Last month in [place].  Chronic medical problems: Hyperlipidemia, hypertension, ED, and former smoker.  Hypertension, currently he is on lisinopril 20 mg daily. He is not checking BP regularly. Denies severe/frequent headache, visual changes, chest pain, dyspnea, palpitation, claudication, focal weakness, or edema.  Lab Results  Component Value Date   CREATININE 0.93 07/25/2017   BUN 22 07/25/2017   NA 142 07/25/2017   K 4.5 07/25/2017   CL 105 07/25/2017   CO2 28 07/25/2017   Hyperlipidemia, currently he is on atorvastatin 40 mg daily. He follows a low-fat diet. He is tolerating medication well.  He recently had his CPE in [place]. According the patient, he has labs done and everything was otherwise normal. He needs a letter stating that he is seeing a good state of health in order to get life insurance ("statement of good health for life insurance policy").  He denies history of CAD, diabetes, or PAD. Negative for urinary symptoms.  He exercises regularly, he walks 5 miles daily. He follows a healthful diet. Former cigar smoker, he has not "used any tobacco products" in 5 years.  Review of Systems  Constitutional: Negative for activity change, appetite change, fatigue, fever and unexpected weight change.  HENT: Negative for nosebleeds and sore throat.   Eyes: Negative for redness and visual disturbance.  Respiratory: Negative for cough and wheezing.   Gastrointestinal: Negative for abdominal pain, nausea and vomiting.  Genitourinary: Negative for decreased urine volume, dysuria and hematuria.  Musculoskeletal: Negative for gait problem and myalgias.  Skin: Negative for rash.  Neurological: Negative for syncope, facial asymmetry and headaches.  Rest see pertinent positives and negatives per HPI.   Current Outpatient  Medications on File Prior to Visit  Medication Sig Dispense Refill  . atorvastatin (LIPITOR) 40 MG tablet TAKE 1 TABLET(40 MG) BY MOUTH DAILY 90 tablet 1  . lisinopril (PRINIVIL,ZESTRIL) 20 MG tablet TAKE 1 TABLET(20 MG) BY MOUTH DAILY 30 tablet 0  . sildenafil (REVATIO) 20 MG tablet 2.  Daily as directed 90 tablet 2   No current facility-administered medications on file prior to visit.     Past Medical History:  Diagnosis Date  . Hyperlipidemia   . Hypertension    No Known Allergies  Family History  Problem Relation Age of Onset  . Cancer Father        brain    Social History   Socioeconomic History  . Marital status: Married    Spouse name: Not on file  . Number of children: Not on file  . Years of education: Not on file  . Highest education level: Not on file  Occupational History  . Not on file  Social Needs  . Financial resource strain: Not on file  . Food insecurity    Worry: Not on file    Inability: Not on file  . Transportation needs    Medical: Not on file    Non-medical: Not on file  Tobacco Use  . Smoking status: Former Smoker    Types: Cigarettes, Cigars    Start date: 07/22/2014  . Smokeless tobacco: Never Used  . Tobacco comment: 1 cigarette a month  Substance and Sexual Activity  . Alcohol use: Yes    Alcohol/week: 0.0 standard drinks  . Drug use: No  . Sexual activity: Not on file  Lifestyle  .  Physical activity    Days per week: Not on file    Minutes per session: Not on file  . Stress: Not on file  Relationships  . Social Musicianconnections    Talks on phone: Not on file    Gets together: Not on file    Attends religious service: Not on file    Active member of club or organization: Not on file    Attends meetings of clubs or organizations: Not on file    Relationship status: Not on file  Other Topics Concern  . Not on file  Social History Narrative  . Not on file    Vitals:   07/23/19 1314  BP: 122/72  Pulse: 70  Resp: 12  Temp:  97.9 F (36.6 C)  SpO2: 98%    Body mass index is 31.4 kg/m. Wt Readings from Last 3 Encounters:  07/23/19 206 lb 8 oz (93.7 kg)  07/25/17 190 lb 12.8 oz (86.5 kg)  12/23/16 200 lb 9.6 oz (91 kg)    Physical Exam  Nursing note reviewed. Constitutional: He is oriented to person, place, and time. He appears well-developed. No distress.  HENT:  Head: Normocephalic and atraumatic.  Mouth/Throat: Oropharynx is clear and moist and mucous membranes are normal.  Eyes: Pupils are equal, round, and reactive to light. Conjunctivae are normal.  Cardiovascular: Normal rate and regular rhythm.  No murmur heard. Pulses:      Dorsalis pedis pulses are 2+ on the right side and 2+ on the left side.  Respiratory: Effort normal and breath sounds normal. No respiratory distress.  GI: Soft. He exhibits no mass. There is no hepatomegaly. There is no abdominal tenderness.  Musculoskeletal:        General: No edema.  Lymphadenopathy:    He has no cervical adenopathy.  Neurological: He is alert and oriented to person, place, and time. He has normal strength. No cranial nerve deficit. Gait normal.  Skin: Skin is warm. No rash noted. No erythema.  Psychiatric: He has a normal mood and affect.  Well groomed, good eye contact.   ASSESSMENT AND PLAN:  [name] was seen today for transfer of care.  Diagnoses and all orders for this visit:  Dyslipidemia Reporting normal recent lipid panel. Continue nonpharmacologic treatment. Continue following annually.  Essential hypertension, benign BP adequately controlled. Recommend monitoring BP regularly. Possible complications of elevated BP discussed. Continue lisinopril 20 mg daily. Low-salt diet recommended.  Class 1 obesity with serious comorbidity and body mass index (BMI) of 31.0 to 31.9 in adult, unspecified obesity type Has gained about 16 lb since his last visit with Dr Amador CunasKwiatkowski. We discussed benefits of wt loss as well as adverse effects  of obesity. Consistency with healthy diet and physical activity recommended.   He will have copy of recent CPE and lab fax to the office and letter for insurance company will be provided after reviewing records. He would like to continue following annually.  Return in about 1 year (around 07/22/2020) for cpe and f/u.     Kushi Kun G. [place], MD  [facility]. [facility] office.

## 2019-08-06 ENCOUNTER — Other Ambulatory Visit: Payer: Self-pay | Admitting: Family Medicine

## 2019-08-06 NOTE — Telephone Encounter (Signed)
Copied from [place] [id number]. Topic: Quick Communication - Rx Refill/Question >> Aug 06, 2019  2:32 PM Leward Quan A wrote: Medication: atorvastatin (LIPITOR) 40 MG tablet   Has the patient contacted their pharmacy? Yes.   (Agent: If no, request that the patient contact the pharmacy for the refill.) (Agent: If yes, when and what did the pharmacy advise?)  Preferred Pharmacy (with phone number or street name): [facility] [place], [address] AT [phone] (Phone) [phone] (Fax)    Agent: Please be advised that RX refills may take up to 3 business days. We ask that you follow-up with your pharmacy.

## 2019-08-06 NOTE — Telephone Encounter (Signed)
Requested medication (s) are due for refill today: yes  Requested medication (s) are on the active medication list: yes   Future visit scheduled: no  Notes to clinic: review for refill   Requested Prescriptions  Pending Prescriptions Disp Refills   atorvastatin (LIPITOR) 40 MG tablet 90 tablet 1    Sig: TAKE 1 TABLET(40 MG) BY MOUTH DAILY     Cardiovascular:  Antilipid - Statins Failed - 08/06/2019  2:36 PM      Failed - Total Cholesterol in normal range and within 360 days    Cholesterol  Date Value Ref Range Status  07/25/2017 169 0 - 200 mg/dL Final    Comment:    ATP III Classification       Desirable:  < 200 mg/dL               Borderline High:  200 - 239 mg/dL          High:  > = 240 mg/dL         Failed - LDL in normal range and within 360 days    LDL Cholesterol  Date Value Ref Range Status  07/25/2017 102 (H) 0 - 99 mg/dL Final         Failed - HDL in normal range and within 360 days    HDL  Date Value Ref Range Status  07/25/2017 55.40 >39.00 mg/dL Final         Failed - Triglycerides in normal range and within 360 days    Triglycerides  Date Value Ref Range Status  07/25/2017 55.0 0.0 - 149.0 mg/dL Final    Comment:    Normal:  <150 mg/dLBorderline High:  150 - 199 mg/dL         Passed - Patient is not pregnant      Passed - Valid encounter within last 12 months    Recent Outpatient Visits          2 weeks ago Dyslipidemia   Therapist, music at [facility] [place], Malka So, MD   2 years ago Essential hypertension, benign   Therapist, music at [facility], Doretha Sou, MD   3 years ago Encounter for preventive health examination   Therapist, music at [facility], Doretha Sou, MD   3 years ago Acute upper respiratory infection   Therapist, music at [facility] [employer], [place], NP   3 years ago Dyslipidemia   Therapist, music at [facility], Doretha Sou, MD

## 2019-08-07 MED ORDER — ATORVASTATIN CALCIUM 40 MG PO TABS: ORAL_TABLET | ORAL | 1 refills | Status: DC

## 2019-08-13 ENCOUNTER — Other Ambulatory Visit: Payer: Self-pay | Admitting: *Deleted

## 2019-08-13 MED ORDER — LISINOPRIL 20 MG PO TABS: ORAL_TABLET | ORAL | 3 refills | Status: DC

## 2019-10-21 ENCOUNTER — Other Ambulatory Visit (INDEPENDENT_AMBULATORY_CARE_PROVIDER_SITE_OTHER): Payer: 59

## 2019-10-21 ENCOUNTER — Other Ambulatory Visit: Payer: Self-pay

## 2019-10-21 ENCOUNTER — Encounter: Payer: Self-pay | Admitting: Family Medicine

## 2019-10-21 ENCOUNTER — Telehealth (INDEPENDENT_AMBULATORY_CARE_PROVIDER_SITE_OTHER): Payer: 59 | Admitting: Family Medicine

## 2019-10-21 VITALS — Ht 68.0 in

## 2019-10-21 DIAGNOSIS — I10 Essential (primary) hypertension: Secondary | ICD-10-CM | POA: Diagnosis not present

## 2019-10-21 DIAGNOSIS — M25561 Pain in right knee: Secondary | ICD-10-CM | POA: Diagnosis not present

## 2019-10-21 DIAGNOSIS — R739 Hyperglycemia, unspecified: Secondary | ICD-10-CM | POA: Diagnosis not present

## 2019-10-21 DIAGNOSIS — E785 Hyperlipidemia, unspecified: Secondary | ICD-10-CM | POA: Diagnosis not present

## 2019-10-21 MED ORDER — LISINOPRIL 20 MG PO TABS: ORAL_TABLET | ORAL | 3 refills | Status: DC

## 2019-10-21 MED ORDER — NAPROXEN 500 MG PO TABS: 500.0000 mg | ORAL_TABLET | Freq: Two times a day (BID) | ORAL | 0 refills | Status: DC | PRN

## 2019-10-21 MED ORDER — ATORVASTATIN CALCIUM 40 MG PO TABS: ORAL_TABLET | ORAL | 3 refills | Status: DC

## 2019-10-21 NOTE — Progress Notes (Signed)
Virtual Visit via Video Note   I connected with Charles Rowe on 10/21/19 by a video enabled telemedicine application and verified that I am speaking with the correct person using two identifiers.  Location patient: home Location provider:work office Persons participating in the virtual visit: patient, provider  I discussed the limitations of evaluation and management by telemedicine and the availability of in person appointments. The patient expressed understanding and agreed to proceed.   HPI: Charles Rowe is a 48 yo male with Hx of HTN and HLD c/o 2 weeks of right knee pain and stiffness. Gradual onset.  Medial right knee pain,soreness and pressure, intermittently, 3/10. No hx of trauma. He has Rowe walking and running about 5 miles daily for the past week and 4 days ago he was in a spinning class and aggravated pain. No limitation of ROM. Pain is exacerbated by prolonged walking and alleviated by rest. He has not noted erythema, + "slight" edema.  He has taken Advil x 2 and Aleve x 1.  He is concerned about possible RA.  He denies fever,chills,fatigue,oral lesions,skin rash,tingling,or numbness. No other joint pain.   He also needs refills on his medications. Last f/u on 07/23/19. HTN: Denies severe/frequent headache, visual changes, chest pain, dyspnea, palpitation, claudication, focal weakness, or edema.  Lab Results  Component Value Date   CREATININE 0.93 07/25/2017   BUN 22 07/25/2017   NA 142 07/25/2017   K 4.5 07/25/2017   CL 105 07/25/2017   CO2 28 07/25/2017   He had blood work in [place] earlier this year, he is not sure about results.  Reports BP in normal range.  HLD: He is on Atorvastatin 40 mg daily.  Lab Results  Component Value Date   CHOL 169 07/25/2017   HDL 55.40 07/25/2017   LDLCALC 102 (H) 07/25/2017   LDLDIRECT 129.0 10/12/2015   TRIG 55.0 07/25/2017   CHOLHDL 3 07/25/2017   Fasting glucose elevated in 07/2018 at 120. Denies abdominal pain,  nausea,vomiting, polydipsia,polyuria, or polyphagia.   ROS: See pertinent positives and negatives per HPI.  Past Medical History:  Diagnosis Date  . Hyperlipidemia   . Hypertension     Past Surgical History:  Procedure Laterality Date  . GALLBLADDER SURGERY      Family History  Problem Relation Age of Onset  . Cancer Father        brain    Social History   Socioeconomic History  . Marital status: Married    Spouse name: Not on file  . Number of children: Not on file  . Years of education: Not on file  . Highest education level: Not on file  Occupational History  . Not on file  Social Needs  . Financial resource strain: Not on file  . Food insecurity    Worry: Not on file    Inability: Not on file  . Transportation needs    Medical: Not on file    Non-medical: Not on file  Tobacco Use  . Smoking status: Former Smoker    Types: Cigarettes, Cigars    Start date: 07/22/2014  . Smokeless tobacco: Never Used  . Tobacco comment: 1 cigarette a month  Substance and Sexual Activity  . Alcohol use: Yes    Alcohol/week: 0.0 standard drinks  . Drug use: No  . Sexual activity: Not on file  Lifestyle  . Physical activity    Days per week: Not on file    Minutes per session: Not on file  . Stress:  Not on file  Relationships  . Social Herbalist on phone: Not on file    Gets together: Not on file    Attends religious service: Not on file    Active member of club or organization: Not on file    Attends meetings of clubs or organizations: Not on file    Relationship status: Not on file  . Intimate partner violence    Fear of current or ex partner: Not on file    Emotionally abused: Not on file    Physically abused: Not on file    Forced sexual activity: Not on file  Other Topics Concern  . Not on file  Social History Narrative  . Not on file     Current Outpatient Medications:  .  atorvastatin (LIPITOR) 40 MG tablet, TAKE 1 TABLET(40 MG) BY MOUTH  DAILY, Disp: 90 tablet, Rfl: 3 .  lisinopril (ZESTRIL) 20 MG tablet, TAKE 1 TABLET(20 MG) BY MOUTH DAILY, Disp: 90 tablet, Rfl: 3 .  naproxen (NAPROSYN) 500 MG tablet, Take 1 tablet (500 mg total) by mouth 2 (two) times daily as needed. With food., Disp: 30 tablet, Rfl: 0 .  sildenafil (REVATIO) 20 MG tablet, 2.  Daily as directed, Disp: 90 tablet, Rfl: 2  EXAM:  VITALS per patient if applicable:Ht 5\' 8"  (1.727 m)   BMI 31.40 kg/m   GENERAL: alert, oriented, appears well and in no acute distress  HEENT: atraumatic, conjunctiva clear, no obvious abnormalities on inspection.  NECK: normal movements of the head and neck  LUNGS: on inspection no signs of respiratory distress, breathing rate appears normal, no obvious gross SOB, gasping or wheezing  CV: no obvious cyanosis  MS: Right knee with normal ROM, mild pain elicited with flexion. No erythema, ? Mild effusion.  PSYCH/NEURO: pleasant and cooperative, no obvious depression or anxiety, speech and thought processing grossly intact  ASSESSMENT AND PLAN:  Discussed the following assessment and plan:  Lab Results  Component Value Date   CREATININE 0.82 10/21/2019   BUN 25 (H) 10/21/2019   NA 140 10/21/2019   K 3.8 10/21/2019   CL 101 10/21/2019   CO2 29 10/21/2019   Lab Results  Component Value Date   HGBA1C 5.7 10/21/2019    Right knee pain, unspecified chronicity - Plan: Ambulatory referral to Physical Therapy, naproxen (NAPROSYN) 500 MG tablet We discussed possible etiologies. No Hx of trauma, so I do not think imaging is needed at this time. ? OA. He agrees with trying PT. Side effects of NSAID's discussed. F/U as needed.  Essential hypertension, benign - Plan: Basic Metabolic Panel Recommend monitoring BP at home. No changes in bowel habits. Continue low salt diet. He follows with PCP in [place], so I think it is appropriate to continue annual follow ups.  Dyslipidemia Reporting labs done a couple months ago  in [place]. Continue Atorvastatin 40 mg daily.  Hyperglycemia - Plan: Hemoglobin A1c Healthy life style recommended for primary prevention of diabetes. Further recommendations according to HgA1C results.   I discussed the assessment and treatment plan with the patient. He was provided an opportunity to ask questions and all were answered. He agreed with the plan and demonstrated an understanding of the instructions.   The patient was advised to call back or seek an in-person evaluation if the symptoms worsen or if the condition fails to improve as anticipated.  Return in about 1 year (around 10/20/2020) for HTN,HLD.     [place], MD

## 2019-10-22 ENCOUNTER — Encounter: Payer: Self-pay | Admitting: Physical Therapy

## 2019-10-22 ENCOUNTER — Ambulatory Visit: Payer: 59 | Attending: Family Medicine | Admitting: Physical Therapy

## 2019-10-22 ENCOUNTER — Other Ambulatory Visit: Payer: Self-pay

## 2019-10-22 DIAGNOSIS — M25561 Pain in right knee: Secondary | ICD-10-CM | POA: Diagnosis not present

## 2019-10-22 DIAGNOSIS — M62838 Other muscle spasm: Secondary | ICD-10-CM | POA: Insufficient documentation

## 2019-10-22 LAB — BASIC METABOLIC PANEL
BUN: 25 mg/dL — ABNORMAL HIGH (ref 6–23)
CO2: 29 mEq/L (ref 19–32)
Calcium: 9.4 mg/dL (ref 8.4–10.5)
Chloride: 101 mEq/L (ref 96–112)
Creatinine, Ser: 0.82 mg/dL (ref 0.40–1.50)
GFR: 99.52 mL/min (ref >60.00)
Glucose, Bld: 94 mg/dL (ref 70–99)
Potassium: 3.8 mEq/L (ref 3.5–5.1)
Sodium: 140 mEq/L (ref 135–145)

## 2019-10-22 LAB — HEMOGLOBIN A1C: Hgb A1c MFr Bld: 5.7 % (ref 4.6–6.5)

## 2019-10-22 NOTE — Therapy (Signed)
[facility] [address] [place], [place], [address] Phone: [phone]   Fax:  [phone]  Physical Therapy Evaluation  Patient Details  Name: Charles Rowe MRN: [id number] Date of Birth: 1971/02/05 Referring Provider (PT): [place], Betty G, MD   Encounter Date: 10/22/2019  PT End of Session - 10/22/19 1728    Visit Number  1    Date for PT Re-Evaluation  12/17/19    PT Start Time  1617    PT Stop Time  1700    PT Time Calculation (min)  43 min    Activity Tolerance  Patient tolerated treatment well    Behavior During Therapy  [facility] tasks assessed/performed       Past Medical History:  Diagnosis Date  . Hyperlipidemia   . Hypertension     Past Surgical History:  Procedure Laterality Date  . GALLBLADDER SURGERY      There were no vitals filed for this visit.   Subjective Assessment - 10/22/19 1620    Subjective  Pt states before this pain that started last week, he was walking/running daily.  He was mostly walking for 5 miles/day with a small amount of running intermittently.  He has been doing this since March.  Recently, he did a spin class last Tuesday and got really stiff and sore after that.  Then 4 mile walk the next Thursday. Now feeling very stiff and sore and difficult to do any walking.  Feels stiff and medial knee tenderness.    Limitations  Walking    How long can you stand comfortably?  unable without pain    How long can you walk comfortably?  unable without pain    Patient Stated Goals  be able to walk without pain    Currently in Pain?  Yes    Pain Score  5     Pain Location  Knee    Pain Orientation  Right;Medial    Pain Descriptors / Indicators  Tightness    Pain Type  Acute pain    Pain Onset  In the past 7 days    Pain Frequency  Intermittent    Aggravating Factors   walking and weight bearing    Pain Relieving Factors  anti-inflammatory, maybe heat    Effect of Pain on Daily  Activities  going on daily walks    Multiple Pain Sites  No         [facility] PT Assessment - 10/23/19 0001      Assessment   Medical Diagnosis  M25.561 (ICD-10-CM) - Right knee pain, unspecified chronicity    Referring Provider (PT)  [place], Betty G, MD      Precautions   Precautions  None      Restrictions   Weight Bearing Restrictions  No      Balance Screen   Has the patient fallen in the past 6 months  No      Home Environment   Living Environment  Private residence    Living Arrangements  Spouse/significant other   3 boys 5519, 5216, 12     Prior Function   Level of Independence  Independent    Vocation  Full time employment    Vocation Requirements  sitting, sometimes walking to meetings    Leisure  walking      Cognition   Overall Cognitive Status  Within Functional Limits for tasks assessed      Observation/Other Assessments   Focus on Therapeutic  Outcomes (FOTO)   57% limited      Posture/Postural Control   Posture/Postural Control  Postural limitations    Postural Limitations  Increased lumbar lordosis      AROM   Overall AROM Comments  Rt knee126 flex and lacking 2 deg to neutral; 136 flex left knee      Strength   Overall Strength Comments  mild core weakness, LE 5/5 needing a lot of tactile cues to activate glute med instead of using TFL      Flexibility   Soft Tissue Assessment /Muscle Length  yes    Hamstrings  50% limited bilaterally      Palpation   Palpation comment  TTP distal medial hamstring attachment; Tight hamstrings, TFL and IT band Rt side, gluteals, gastroc      Ambulation/Gait   Gait Comments  does not have full knee ext Rt                Objective measurements completed on examination: See above findings.              PT Education - 10/23/19 1259    Education Details  Access Code: [email or web address]    Person(s) Educated  Patient    Methods  Explanation;Demonstration;Verbal cues;Handout;Tactile cues    Comprehension   Verbalized understanding;Returned demonstration       PT Short Term Goals - 10/23/19 1307      PT SHORT TERM GOAL #1   Title  Pt will be able to walk short distances such as across the parking lot with 75% less pain and stiffness    Time  4    Period  Weeks    Status  New    Target Date  11/19/19      PT SHORT TERM GOAL #2   Title  Pt will be able to stand for at least 5 minutes without pain    Time  4    Period  Weeks    Status  New    Target Date  11/19/19        PT Long Term Goals - 10/23/19 1300      PT LONG TERM GOAL #1   Title  Pt will be able to return to walking at least 2 miles in order to return to healthy lifestyle and normal activities    Time  8    Period  Weeks    Status  New    Target Date  12/17/19      PT LONG TERM GOAL #2   Title  FOTO < or = to 26% limited    Time  8    Period  Weeks    Status  New    Target Date  12/17/19      PT LONG TERM GOAL #3   Title  pt will be ind with HEP in order to maintain improved lumbopelvic stability and LE flexibility for greater pain management    Time  8    Period  Weeks    Status  New    Target Date  12/17/19      PT LONG TERM GOAL #4   Title  pt will report at least 75% less pain during typical daily activities    Time  8    Period  Weeks    Status  New    Target Date  12/17/19             Plan - 10/23/19 1309  Clinical Impression Statement  Pt presents to skilled PT due to acute knee pain after doing a spin class.  Pt walks daily, but also sits a lot for his job.  He has decreased hamstring flexibility and tension in Rt TFL and IT band.  Pt has 5/5 LE strength but does overuse TFL instead of glute med and mild weakness of core.  Pt has muscle spasms bilateral hamstrings, gluteals, and quads.  Rt TFL and IT band is tighter than Left.  Pt will benefit from skilled PT to improve and address impairments mentioned so he can perform his normal activities without pain.    Stability/Clinical Decision  Making  Stable/Uncomplicated    Clinical Decision Making  Low    Rehab Potential  Excellent    PT Frequency  2x / week    PT Duration  8 weeks    PT Treatment/Interventions  ADLs/Self Care Home Management;Biofeedback;Cryotherapy;Electrical Stimulation;Iontophoresis 4mg /ml Dexamethasone;Moist Heat;Neuromuscular re-education;Therapeutic exercise;Therapeutic activities;Patient/family education;Manual techniques;Taping;Dry needling;Passive range of motion    PT Next Visit Plan  talk about dry needling    Consulted and Agree with Plan of Care  Patient       Patient will benefit from skilled therapeutic intervention in order to improve the following deficits and impairments:  Abnormal gait, Pain, Difficulty walking, Impaired flexibility, Decreased strength, Increased muscle spasms  Visit Diagnosis: Acute pain of right knee  Other muscle spasm     Problem List Patient Active Problem List   Diagnosis Date Noted  . Essential hypertension, benign 10/10/2013  . CHEST PAIN 03/05/2010  . Dyslipidemia 02/18/2010    02/20/2010, PT 10/23/2019, 2:00 PM  [facility] [address] [place], [place], [place] Phone: [phone]   Fax:  [phone]  Name: Montrelle Eddings MRN: Charles Better Date of Birth: 1971/09/29

## 2019-10-23 ENCOUNTER — Ambulatory Visit: Payer: 59 | Admitting: Physical Therapy

## 2019-10-23 ENCOUNTER — Other Ambulatory Visit: Payer: Self-pay

## 2019-10-23 DIAGNOSIS — M62838 Other muscle spasm: Secondary | ICD-10-CM

## 2019-10-23 DIAGNOSIS — M25561 Pain in right knee: Secondary | ICD-10-CM | POA: Diagnosis not present

## 2019-10-23 NOTE — Patient Instructions (Signed)
Access Code: [email or web address]  URL: [email or web address]  Date: 10/23/2019  Prepared by: Jari Favre   Exercises  Supine Hamstring Stretch with Strap - 3 reps - 1 sets - 30 sec hold - 1x daily - 7x weekly  Prone Quadriceps Stretch with Strap - 3 reps - 1 sets - 30sec hold - 1x daily - 7x weekly  Gastroc Stretch on Wall - 3 reps - 1 sets - 30 sec hold - 1x daily - 7x weekly  Standing ITB Stretch - 3 reps - 1 sets - 30 sec hold - 1x daily - 7x weekly

## 2019-10-23 NOTE — Patient Instructions (Addendum)
Trigger Point Dry Needling  . What is Trigger Point Dry Needling (DN)? o DN is a physical therapy technique used to treat muscle pain and dysfunction. Specifically, DN helps deactivate muscle trigger points (muscle knots).  o A thin filiform needle is used to penetrate the skin and stimulate the underlying trigger point. The goal is for a local twitch response (LTR) to occur and for the trigger point to relax. No medication of any kind is injected during the procedure.   . What Does Trigger Point Dry Needling Feel Like?  o The procedure feels different for each individual patient. Some patients report that they do not actually feel the needle enter the skin and overall the process is not painful. Very mild bleeding may occur. However, many patients feel a deep cramping in the muscle in which the needle was inserted. This is the local twitch response.   Marland Kitchen How Will I feel after the treatment? o Soreness is normal, and the onset of soreness may not occur for a few hours. Typically this soreness does not last longer than two days.  o Bruising is uncommon, however; ice can be used to decrease any possible bruising.  o In rare cases feeling tired or nauseous after the treatment is normal. In addition, your symptoms may get worse before they get better, this period will typically not last longer than 24 hours.   . What Can I do After My Treatment? o Increase your hydration by drinking more water for the next 24 hours. o You may place ice or heat on the areas treated that have become sore, however, do not use heat on inflamed or bruised areas. Heat often brings more relief post needling. o You can continue your regular activities, but vigorous activity is not recommended initially after the treatment for 24 hours. o DN is best combined with other physical therapy such as strengthening, stretching, and other therapies.    [facility] [address] [place], [place]  [address] Phone # [phone] Fax [phone] Access Code: [email or web address]

## 2019-10-23 NOTE — Therapy (Signed)
[facility] [address] [place], [place], [address] Phone: [phone]   Fax:  [phone]  Physical Therapy Treatment  Patient Details  Name: Charles Rowe MRN: [id number] Date of Birth: 07/14/1971 Referring Provider (PT): [place], Betty G, MD   Encounter Date: 10/23/2019  PT End of Session - 10/24/19 0802    Visit Number  2    Date for PT Re-Evaluation  12/17/19    PT Start Time  1531    PT Stop Time  1612    PT Time Calculation (min)  41 min    Activity Tolerance  Patient tolerated treatment well    Behavior During Therapy  [facility] for tasks assessed/performed       Past Medical History:  Diagnosis Date  . Hyperlipidemia   . Hypertension     Past Surgical History:  Procedure Laterality Date  . GALLBLADDER SURGERY      There were no vitals filed for this visit.  Subjective Assessment - 10/23/19 1536    Subjective  Pt states walking into the clinic there was a little more pain, but sitting and lying no pain.  Pt took the anti-inflammatory medicine this morning and did the stretches then mostly sitting at work all day    Limitations  Walking    Currently in Pain?  Yes    Pain Score  6    walking in today   Multiple Pain Sites  No                       [facility] Adult PT Treatment/Exercise - 10/24/19 0001      Ambulation/Gait   Gait Comments  does not have full knee ext Rt      Posture/Postural Control   Posture/Postural Control  Postural limitations    Postural Limitations  Increased lumbar lordosis      Exercises   Exercises  Knee/Hip      Knee/Hip Exercises: Stretches   Active Hamstring Stretch  Right;30 seconds    Quad Stretch  Right;30 seconds      Knee/Hip Exercises: Prone   Hip Extension  Strengthening;Right;20 reps      Manual Therapy   Manual Therapy  Soft tissue mobilization    Soft tissue mobilization  Rt quads, hamstrings, adductors       Trigger Point Dry  Needling - 10/24/19 0001    Consent Given?  Yes    Education Handout Provided  Yes    Muscles Treated Lower Quadrant  Quadriceps;Hamstring    Quadriceps Response  Twitch response elicited;Palpable increased muscle length    Hamstring Response  Twitch response elicited;Palpable increased muscle length           PT Education - 10/24/19 0812    Education Details  Access Code: [email or web address]    Person(s) Educated  Patient    Methods  Explanation;Demonstration;Tactile cues;Verbal cues;Handout    Comprehension  Verbalized understanding;Returned demonstration       PT Short Term Goals - 10/23/19 1307      PT SHORT TERM GOAL #1   Title  Pt will be able to walk short distances such as across the parking lot with 75% less pain and stiffness    Time  4    Period  Weeks    Status  New    Target Date  11/19/19      PT SHORT TERM GOAL #2   Title  Pt will be able to stand for at  least 5 minutes without pain    Time  4    Period  Weeks    Status  New    Target Date  11/19/19        PT Long Term Goals - 10/23/19 1300      PT LONG TERM GOAL #1   Title  Pt will be able to return to walking at least 2 miles in order to return to healthy lifestyle and normal activities    Time  8    Period  Weeks    Status  New    Target Date  12/17/19      PT LONG TERM GOAL #2   Title  FOTO < or = to 26% limited    Time  8    Period  Weeks    Status  New    Target Date  12/17/19      PT LONG TERM GOAL #3   Title  pt will be ind with HEP in order to maintain improved lumbopelvic stability and LE flexibility for greater pain management    Time  8    Period  Weeks    Status  New    Target Date  12/17/19      PT LONG TERM GOAL #4   Title  pt will report at least 75% less pain during typical daily activities    Time  8    Period  Weeks    Status  New    Target Date  12/17/19            Plan - 10/24/19 0807    Clinical Impression Statement  Today's session focues on intial HEP and  working on increased muscle length and knee ROM.  Pt tolerated dry needling and STM.  He reports feeling better when walking immediately after today's treatment.  Pt appears to have less glute activity and slight trunk lean to the Rt when WB on Rt LE.  Pt was given prone hip extensions to work on glute activation added to HEP today.  he was educated on doing the stretches throughout the day to avoid muscles getting so stiff.  He still has tenderness at distal adductor and hamstring attachments, but slightly less after treatment.  Pt will benefit form skilled PT to address impairments.    PT Treatment/Interventions  ADLs/Self Care Home Management;Biofeedback;Cryotherapy;Electrical Stimulation;Iontophoresis 4mg /ml Dexamethasone;Moist Heat;Neuromuscular re-education;Therapeutic exercise;Therapeutic activities;Patient/family education;Manual techniques;Taping;Dry needling;Passive range of motion    PT Next Visit Plan  f/u on DN# 1, glute and core strengthening, nustep or bike warm up    PT Home Exercise Plan  Access Code: [email or web address]    Consulted and Agree with Plan of Care  Patient       Patient will benefit from skilled therapeutic intervention in order to improve the following deficits and impairments:  Abnormal gait, Pain, Difficulty walking, Impaired flexibility, Decreased strength, Increased muscle spasms  Visit Diagnosis: No diagnosis found.     Problem List Patient Active Problem List   Diagnosis Date Noted  . Essential hypertension, benign 10/10/2013  . CHEST PAIN 03/05/2010  . Dyslipidemia 02/18/2010    Jule Ser, PT 10/24/2019, 8:17 AM  [facility] [address] [place], [place], [address] Phone: [phone]   Fax:  [phone]  Name: Charles Rowe MRN: [id number] Date of Birth: 08/27/1971

## 2019-10-24 ENCOUNTER — Encounter: Payer: Self-pay | Admitting: Family Medicine

## 2019-10-30 ENCOUNTER — Other Ambulatory Visit: Payer: Self-pay

## 2019-10-30 ENCOUNTER — Ambulatory Visit: Payer: 59

## 2019-10-30 DIAGNOSIS — M25561 Pain in right knee: Secondary | ICD-10-CM

## 2019-10-30 DIAGNOSIS — M62838 Other muscle spasm: Secondary | ICD-10-CM

## 2019-10-30 NOTE — Patient Instructions (Signed)
Access Code: [email or web address]  URL: [email or web address]  Date: 10/30/2019  Prepared by: Sigurd Sos   Exercises  Seated Hamstring Stretch - 3 reps - 20 hold - 3x daily - 7x weekly Forward Step Down - 10 reps - 2 sets - 2x daily - 7x weekly Lateral Lunge with Slider - 10 reps - 1 sets - 1x daily - 7x weekly

## 2019-10-30 NOTE — Therapy (Signed)
[facility] [address] [place], [place], [address] Phone: [phone]   Fax:  [phone]  Physical Therapy Treatment  Patient Details  Name: Charles Rowe MRN: [id number] Date of Birth: [birth year] Referring Provider (PT): [place], Betty G, MD   Encounter Date: 10/30/2019  PT End of Session - 10/30/19 1452    Visit Number  3    Date for PT Re-Evaluation  12/17/19    Authorization Type  [employer]- 25 visit limit    PT Start Time  1401    PT Stop Time  1443    PT Time Calculation (min)  42 min    Activity Tolerance  Patient tolerated treatment well       Past Medical History:  Diagnosis Date  . Hyperlipidemia   . Hypertension     Past Surgical History:  Procedure Laterality Date  . GALLBLADDER SURGERY      There were no vitals filed for this visit.  Subjective Assessment - 10/30/19 1356    Subjective  I am feeling better overall. I think the stretching is helping and the dry needling as well.  I have been able to walk pretty well without pain.    Patient Stated Goals  be able to walk without pain    Currently in Pain?  Yes    Pain Score  0-No pain   tightness at night   Pain Location  Ankle    Pain Orientation  Right;Medial    Pain Descriptors / Indicators  Tightness                       [facility] Adult PT Treatment/Exercise - 10/30/19 0001      Knee/Hip Exercises: Stretches   Active Hamstring Stretch  Right;3 reps;20 seconds      Knee/Hip Exercises: Standing   Step Down  Right;2 sets;10 reps    Rocker Board  3 minutes    SLS with Vectors  Lt single leg stance on Rt with Lt leg movement      Knee/Hip Exercises: Prone   Hip Extension  Strengthening;Right;20 reps      Manual Therapy   Manual Therapy  Soft tissue mobilization    Soft tissue mobilization  Rt lateral gastroc elongation and release       Trigger Point Dry Needling - 10/30/19 0001    Consent Given?  Yes    Muscles  Treated Lower Quadrant  Gastrocnemius   Rt only   Gastrocnemius Response  Twitch response elicited;Palpable increased muscle length           PT Education - 10/30/19 1422    Education Details  Access Code: [email or web address]    Person(s) Educated  Patient    Methods  Explanation;Demonstration;Handout    Comprehension  Verbalized understanding;Returned demonstration       PT Short Term Goals - 10/30/19 1405      PT SHORT TERM GOAL #1   Title  Pt will be able to walk short distances such as across the parking lot with 75% less pain and stiffness    Status  Achieved      PT SHORT TERM GOAL #2   Title  Pt will be able to stand for at least 5 minutes without pain    Status  Achieved        PT Long Term Goals - 10/23/19 1300      PT LONG TERM GOAL #1   Title  Pt will be  able to return to walking at least 2 miles in order to return to healthy lifestyle and normal activities    Time  8    Period  Weeks    Status  New    Target Date  12/17/19      PT LONG TERM GOAL #2   Title  FOTO < or = to 26% limited    Time  8    Period  Weeks    Status  New    Target Date  12/17/19      PT LONG TERM GOAL #3   Title  pt will be ind with HEP in order to maintain improved lumbopelvic stability and LE flexibility for greater pain management    Time  8    Period  Weeks    Status  New    Target Date  12/17/19      PT LONG TERM GOAL #4   Title  pt will report at least 75% less pain during typical daily activities    Time  8    Period  Weeks    Status  New    Target Date  12/17/19            Plan - 10/30/19 1450    Clinical Impression Statement  Pt reports significant reduction in Rt knee pain since the start of care.  Pt denies any pain with standing  or walking. Pt has not tried to walk for exercise. PT educated pt to try to do short distances and advance this time/distance as able.  Pt wants to try to go skiing in a few weeks so PT issued exercise for Rt knee stability.  Pt required  verbal cues for hip alignment with vectors and step down to avoid hip adduction.  Pt with tension in Rt lateral gastroc muscle and demonstrated improved tissue mobility after dry needling and manual therapy today.  Pt will benefit from skilled PT to improve flexibility, stability and tissue mobility to allow for return to regular exercise without limitation.    PT Frequency  2x / week    PT Duration  8 weeks    PT Treatment/Interventions  ADLs/Self Care Home Management;Biofeedback;Cryotherapy;Electrical Stimulation;Iontophoresis 4mg /ml Dexamethasone;Moist Heat;Neuromuscular re-education;Therapeutic exercise;Therapeutic activities;Patient/family education;Manual techniques;Taping;Dry needling;Passive range of motion    PT Next Visit Plan  follow up on DN to gastroc.  Review HEP for stability and flexibility    PT Home Exercise Plan  Access Code: [email or web address]    Recommended Other Services  initial certification is signed    Consulted and Agree with Plan of Care  Patient       Patient will benefit from skilled therapeutic intervention in order to improve the following deficits and impairments:  Abnormal gait, Pain, Difficulty walking, Impaired flexibility, Decreased strength, Increased muscle spasms  Visit Diagnosis: Other muscle spasm  Acute pain of right knee     Problem List Patient Active Problem List   Diagnosis Date Noted  . Essential hypertension, benign 10/10/2013  . CHEST PAIN 03/05/2010  . Dyslipidemia 02/18/2010    02/20/2010, PT 10/30/19 2:56 PM  [facility] [address] [place], [place], [place] Phone: [phone]   Fax:  [phone]  Name: Charles Rowe MRN: Maxie Better Date of Birth: [birth year]

## 2019-11-06 ENCOUNTER — Ambulatory Visit: Payer: 59

## 2019-11-06 ENCOUNTER — Other Ambulatory Visit: Payer: Self-pay

## 2019-11-06 DIAGNOSIS — M25561 Pain in right knee: Secondary | ICD-10-CM | POA: Diagnosis not present

## 2019-11-06 DIAGNOSIS — M62838 Other muscle spasm: Secondary | ICD-10-CM

## 2019-11-06 NOTE — Therapy (Signed)
[facility] [address], [place], [place], [address] Phone: [phone]   Fax:  [phone]  Physical Therapy Treatment  Patient Details  Name: Charles Rowe MRN: [id number] Date of Birth: 07-Apr-1971 Referring Provider (PT): Martinique, Betty G, MD   Encounter Date: 11/06/2019  PT End of Session - 11/06/19 1442    Visit Number  4    Date for PT Re-Evaluation  12/17/19    Authorization Type  [employer]- 25 visit limit    PT Start Time  1401    PT Stop Time  1442    PT Time Calculation (min)  41 min    Activity Tolerance  Patient tolerated treatment well    Behavior During Therapy  [facility] for tasks assessed/performed       Past Medical History:  Diagnosis Date  . Hyperlipidemia   . Hypertension     Past Surgical History:  Procedure Laterality Date  . GALLBLADDER SURGERY      There were no vitals filed for this visit.  Subjective Assessment - 11/06/19 1402    Subjective  I felt really good after last session.  I walked on Saturday and Sunday for 3 miles at a moderate pace and I did well.    Currently in Pain?  No/denies                       [facility] Adult PT Treatment/Exercise - 11/06/19 0001      Knee/Hip Exercises: Stretches   Active Hamstring Stretch  Right;3 reps;20 seconds      Knee/Hip Exercises: Aerobic   Nustep  Level 2x 6 minutes    PT present to discuss progress     Knee/Hip Exercises: Standing   Step Down  Right;2 sets;10 reps    SLS with Vectors  Lt single leg stance on Rt with Lt leg movement      Manual Therapy   Manual Therapy  Soft tissue mobilization    Soft tissue mobilization  Rt lateral gastroc elongation and release       Trigger Point Dry Needling - 11/06/19 0001    Consent Given?  Yes    Muscles Treated Lower Quadrant  Hamstring;Gastrocnemius   distal hamstring, Rt only   Hamstring Response  Twitch response elicited;Palpable increased muscle length    Gastrocnemius  Response  Twitch response elicited;Palpable increased muscle length             PT Short Term Goals - 11/06/19 1408      PT SHORT TERM GOAL #1   Title  Pt will be able to walk short distances such as across the parking lot with 75% less pain and stiffness    Status  Achieved      PT SHORT TERM GOAL #2   Title  Pt will be able to stand for at least 5 minutes without pain    Status  Achieved        PT Long Term Goals - 11/06/19 1405      PT LONG TERM GOAL #1   Title  Pt will be able to return to walking at least 2 miles in order to return to healthy lifestyle and normal activities    Status  Achieved      PT LONG TERM GOAL #3   Title  pt will be ind with HEP in order to maintain improved lumbopelvic stability and LE flexibility for greater pain management    Time  8  Period  Weeks    Status  On-going      PT LONG TERM GOAL #4   Title  pt will report at least 75% less pain during typical daily activities    Baseline  60%    Time  8    Period  Weeks    Status  On-going            Plan - 11/06/19 1415    Clinical Impression Statement  Pt reports 60% overall improvement in Rt knee pain since the start of care.  Pt has been able to walk for up to 3 miles at a moderate pace without increased pain in the Rt knee.  Pt has not been performing stability exercises suggested last session to prepare for skiing trip due to busy schedule.  Pt denies any significant knee pain at this time.  Pt with improved stability of the Rt knee with step-downs and slider lunges.  Pt requires minor verbal cues for alignment with eccentric motion.  Pt with tension in Rt gastroc and hamstrings and demonstrated improved tissue mobility after dry needling today.  Pt will continue to benefit from skilled PT for Rt knee stability, flexibility and tissue mobility.    PT Frequency  2x / week    PT Duration  8 weeks    PT Treatment/Interventions  ADLs/Self Care Home  Management;Biofeedback;Cryotherapy;Electrical Stimulation;Iontophoresis 4mg /ml Dexamethasone;Moist Heat;Neuromuscular re-education;Therapeutic exercise;Therapeutic activities;Patient/family education;Manual techniques;Taping;Dry needling;Passive range of motion    PT Next Visit Plan  follow up on DN to gastroc.  add to HEP before trip if needed.    PT Home Exercise Plan  Access Code: [email or web address]    Consulted and Agree with Plan of Care  Patient       Patient will benefit from skilled therapeutic intervention in order to improve the following deficits and impairments:  Abnormal gait, Pain, Difficulty walking, Impaired flexibility, Decreased strength, Increased muscle spasms  Visit Diagnosis: Other muscle spasm  Acute pain of right knee     Problem List Patient Active Problem List   Diagnosis Date Noted  . Essential hypertension, benign 10/10/2013  . CHEST PAIN 03/05/2010  . Dyslipidemia 02/18/2010     02/20/2010, PT 11/06/19 2:44 PM  [facility] [address] [place], [place], [place] Phone: [phone]   Fax:  [phone]  Name: Charles Rowe MRN: Maxie Better Date of Birth: 12-19-1970

## 2019-11-11 ENCOUNTER — Other Ambulatory Visit: Payer: Self-pay

## 2019-11-11 ENCOUNTER — Ambulatory Visit: Payer: 59

## 2019-11-11 DIAGNOSIS — M62838 Other muscle spasm: Secondary | ICD-10-CM

## 2019-11-11 DIAGNOSIS — M25561 Pain in right knee: Secondary | ICD-10-CM

## 2019-11-11 NOTE — Therapy (Signed)
[facility] [address] [place], [place], [address] Phone: [phone]   Fax:  [phone]  Physical Therapy Treatment  Patient Details  Name: Charles Rowe MRN: [id number] Date of Birth: 1971/03/21 Referring Provider (PT): Martinique, Betty G, MD   Encounter Date: 11/11/2019  PT End of Session - 11/11/19 1610    Visit Number  5    Date for PT Re-Evaluation  12/17/19    Authorization Type  [employer]- 25 visit limit    Authorization - Visit Number  5    Authorization - Number of Visits  25    PT Start Time  4401    PT Stop Time  1609    PT Time Calculation (min)  40 min    Activity Tolerance  Patient tolerated treatment well    Behavior During Therapy  [facility] for tasks assessed/performed       Past Medical History:  Diagnosis Date  . Hyperlipidemia   . Hypertension     Past Surgical History:  Procedure Laterality Date  . GALLBLADDER SURGERY      There were no vitals filed for this visit.  Subjective Assessment - 11/11/19 1532    Subjective  I have not had time to walk for exercise or to do my stability exercises.  I have been consistent with flexibility.  I think the dry needling is working to loosen the muscles.    Currently in Pain?  No/denies         [facility] PT Assessment - 11/11/19 0001      Assessment   Medical Diagnosis  M25.561 (ICD-10-CM) - Right knee pain, unspecified chronicity    Referring Provider (PT)  Martinique, Betty G, [place] residence      Prior Function   Level of Independence  Independent      Cognition   Overall Cognitive Status  Within Functional Limits for tasks assessed      Observation/Other Assessments   Focus on Therapeutic Outcomes (FOTO)   32% limitation                   [facility] Adult PT Treatment/Exercise - 11/11/19 0001      Knee/Hip Exercises: Aerobic   Nustep  Level 2x 6 minutes    PT present to discuss  progress     Knee/Hip Exercises: Standing   Step Down  Right;2 sets;10 reps    SLS  on blue pod 5x10 seconds    SLS with Vectors  Lt single leg stance on Rt with Lt leg movement      Manual Therapy   Manual Therapy  Soft tissue mobilization    Soft tissue mobilization  Rt lateral gastroc elongation and release       Trigger Point Dry Needling - 11/11/19 0001    Consent Given?  Yes    Muscles Treated Lower Quadrant  Hamstring;Gastrocnemius   distal hamstring, Rt only   Hamstring Response  Twitch response elicited;Palpable increased muscle length    Gastrocnemius Response  Twitch response elicited;Palpable increased muscle length             PT Short Term Goals - 11/06/19 1408      PT SHORT TERM GOAL #1   Title  Pt will be able to walk short distances such as across the parking lot with 75% less pain and stiffness    Status  Achieved  PT SHORT TERM GOAL #2   Title  Pt will be able to stand for at least 5 minutes without pain    Status  Achieved        PT Long Term Goals - 11/11/19 1533      PT LONG TERM GOAL #1   Title  Pt will be able to return to walking at least 2 miles in order to return to healthy lifestyle and normal activities    Status  Achieved      PT LONG TERM GOAL #2   Title  FOTO < or = to 26% limited    Baseline  32% limitation    Time  8    Period  Weeks    Status  On-going      PT LONG TERM GOAL #3   Title  pt will be ind with HEP in order to maintain improved lumbopelvic stability and LE flexibility for greater pain management    Status  Achieved      PT LONG TERM GOAL #4   Title  pt will report at least 75% less pain during typical daily activities    Baseline  60%    Status  Partially Met            Plan - 11/11/19 1546    Clinical Impression Statement  Pt has not had time to walk for exercise due to busy schedule.  FOTO is improved to 32% limitation, a significant improvement since the start of care.  Pt with improved  stability with single limb activity with step down and stance on level surface.  Pt is challenged with ankle strategy with single leg stance on blue pod.  Pt with increased tension in Rt gastroc and distal hamstring and demonstrated improved tissue mobility after manual therapy today.  Pt will be placed on hold and return 11/2019 as needed.    PT Treatment/Interventions  ADLs/Self Care Home Management;Biofeedback;Cryotherapy;Electrical Stimulation;Iontophoresis 73m/ml Dexamethasone;Moist Heat;Neuromuscular re-education;Therapeutic exercise;Therapeutic activities;Patient/family education;Manual techniques;Taping;Dry needling;Passive range of motion    PT Next Visit Plan  pt will return after ski trip as needed.    PT Home Exercise Plan  Access Code: [email or web address]    Consulted and Agree with Plan of Care  Patient       Patient will benefit from skilled therapeutic intervention in order to improve the following deficits and impairments:  Abnormal gait, Pain, Difficulty walking, Impaired flexibility, Decreased strength, Increased muscle spasms  Visit Diagnosis: Acute pain of right knee  Other muscle spasm     Problem List Patient Active Problem List   Diagnosis Date Noted  . Essential hypertension, benign 10/10/2013  . CHEST PAIN 03/05/2010  . Dyslipidemia 02/18/2010    KSigurd Sos PT 11/11/19 4:12 PM  [facility] [address] [place] [address]Phone: [phone]  Fax:  [phone] Name: AAlandis BluemelMRN: [id number] of Birth: [birth year]

## 2019-11-27 ENCOUNTER — Ambulatory Visit: Payer: 59 | Attending: Family Medicine

## 2019-11-27 ENCOUNTER — Other Ambulatory Visit: Payer: Self-pay

## 2019-11-27 DIAGNOSIS — M25561 Pain in right knee: Secondary | ICD-10-CM

## 2019-11-27 DIAGNOSIS — M62838 Other muscle spasm: Secondary | ICD-10-CM | POA: Diagnosis present

## 2019-11-27 NOTE — Therapy (Signed)
[facility] [address], [place], [place], [address] Phone: [phone]   Fax:  [phone]  Physical Therapy Treatment  Patient Details  Name: Charles Rowe MRN: [id number] Date of Birth: [birth year] Referring Provider (PT): Martinique, Betty G, MD   Encounter Date: 11/27/2019  PT End of Session - 11/27/19 1438    Visit Number  6    PT Start Time  1761    PT Stop Time  1435    PT Time Calculation (min)  33 min    Activity Tolerance  Patient tolerated treatment well    Behavior During Therapy  [facility] for tasks assessed/performed       Past Medical History:  Diagnosis Date  . Hyperlipidemia   . Hypertension     Past Surgical History:  Procedure Laterality Date  . GALLBLADDER SURGERY      There were no vitals filed for this visit.  Subjective Assessment - 11/27/19 1405    Subjective  I skiied on my knee and I didn't have any issues with my knee.  I have been walking 5 miles with my wife at nearly the same pace.    Currently in Pain?  No/denies         [facility] PT Assessment - 11/27/19 0001      Assessment   Medical Diagnosis  M25.561 (ICD-10-CM) - Right knee pain, unspecified chronicity    Referring Provider (PT)  Martinique, Betty G, MD      Prior Function   Level of Independence  Independent      Cognition   Overall Cognitive Status  Within Functional Limits for tasks assessed      Observation/Other Assessments   Focus on Therapeutic Outcomes (FOTO)   14% limitation                   [facility] Adult PT Treatment/Exercise - 11/27/19 0001      Manual Therapy   Manual Therapy  Soft tissue mobilization    Soft tissue mobilization  Rt lateral gastroc elongation and release       Trigger Point Dry Needling - 11/27/19 0001    Consent Given?  Yes    Muscles Treated Lower Quadrant  Hamstring;Gastrocnemius   distal hamstring, Rt only   Hamstring Response  Twitch response elicited;Palpable increased  muscle length    Gastrocnemius Response  Twitch response elicited;Palpable increased muscle length             PT Short Term Goals - 11/06/19 1408      PT SHORT TERM GOAL #1   Title  Pt will be able to walk short distances such as across the parking lot with 75% less pain and stiffness    Status  Achieved      PT SHORT TERM GOAL #2   Title  Pt will be able to stand for at least 5 minutes without pain    Status  Achieved        PT Long Term Goals - 11/27/19 1410      PT LONG TERM GOAL #1   Title  Pt will be able to return to walking at least 2 miles in order to return to healthy lifestyle and normal activities    Baseline  5 miles    Status  Achieved      PT LONG TERM GOAL #2   Title  FOTO < or = to 26% limited    Baseline  14% limitation  Status  Achieved      PT LONG TERM GOAL #3   Title  pt will be ind with HEP in order to maintain improved lumbopelvic stability and LE flexibility for greater pain management    Status  Achieved      PT LONG TERM GOAL #4   Title  pt will report at least 75% less pain during typical daily activities    Baseline  --    Status  Achieved            Plan - 11/27/19 1435    Clinical Impression Statement  Pt has met all goals and will be discharged to HEP today.  Pt was able to ski on his Rt knee and has been walking 5 miles for exercise without limitation.  FOTO is improved to 14% limitation.  Pt will continue to perform stretching and strength exercises for the Rt knee.    PT Next Visit Plan  D/C PT to HEP    PT Home Exercise Plan  Access Code: [email or web address]    Consulted and Agree with Plan of Care  Patient       Patient will benefit from skilled therapeutic intervention in order to improve the following deficits and impairments:     Visit Diagnosis: Other muscle spasm  Acute pain of right knee     Problem List Patient Active Problem List   Diagnosis Date Noted  . Essential hypertension, benign 10/10/2013  . CHEST  PAIN 03/05/2010  . Dyslipidemia 02/18/2010   PHYSICAL THERAPY DISCHARGE SUMMARY  Visits from Start of Care: 6  Current functional level related to goals / functional outcomes: See above for current progress.  Pt has met all goals.     Remaining deficits: Rt knee stiffness in the morning.     Education / Equipment: HEP Plan: Patient agrees to discharge.  Patient goals were met. Patient is being discharged due to meeting the stated rehab goals.  ?????        Sigurd Sos, PT 11/27/19 2:39 PM  [facility] [address] [place], [place], [address] Phone: [phone]   Fax:  [phone]  Name: Charles Rowe MRN: [id number] Date of Birth: [birth year]

## 2020-08-30 ENCOUNTER — Other Ambulatory Visit: Payer: Self-pay | Admitting: Family Medicine

## 2021-01-09 ENCOUNTER — Other Ambulatory Visit: Payer: Self-pay | Admitting: Family Medicine

## 2021-02-08 ENCOUNTER — Other Ambulatory Visit: Payer: Self-pay | Admitting: Family Medicine

## 2021-02-09 ENCOUNTER — Other Ambulatory Visit: Payer: Self-pay | Admitting: Family Medicine

## 2021-02-13 ENCOUNTER — Other Ambulatory Visit: Payer: Self-pay | Admitting: Family Medicine

## 2021-03-01 ENCOUNTER — Other Ambulatory Visit: Payer: Self-pay | Admitting: Family Medicine

## 2021-03-10 ENCOUNTER — Other Ambulatory Visit: Payer: Self-pay | Admitting: Family Medicine

## 2021-03-30 ENCOUNTER — Telehealth: Payer: Self-pay | Admitting: Family Medicine

## 2021-03-30 MED ORDER — ATORVASTATIN CALCIUM 40 MG PO TABS: 40.0000 mg | ORAL_TABLET | Freq: Every day | ORAL | 0 refills | Status: DC

## 2021-03-30 MED ORDER — LISINOPRIL 20 MG PO TABS: 20.0000 mg | ORAL_TABLET | Freq: Every day | ORAL | 0 refills | Status: DC

## 2021-03-30 NOTE — Telephone Encounter (Signed)
Rx's sent in. °

## 2021-03-30 NOTE — Telephone Encounter (Signed)
Pt call and stated he need a refill on atorvastatin (LIPITOR) 40 MG tablet and lisinopril (ZESTRIL) 20 MG tablet sent to  [id number] [place], [place] - [address] AT [facility] [address] [facility] [address] [facility] [address] Phone:  [phone]  Fax:  [phone]    Pt have a appt on 04/26/21

## 2021-04-21 ENCOUNTER — Encounter: Payer: 59 | Admitting: Family Medicine

## 2021-04-26 ENCOUNTER — Encounter: Payer: Self-pay | Admitting: Family Medicine

## 2021-04-26 ENCOUNTER — Ambulatory Visit (INDEPENDENT_AMBULATORY_CARE_PROVIDER_SITE_OTHER): Payer: 59 | Admitting: Family Medicine

## 2021-04-26 ENCOUNTER — Other Ambulatory Visit: Payer: Self-pay

## 2021-04-26 VITALS — BP 124/70 | HR 90 | Resp 16 | Ht 68.0 in | Wt 214.5 lb

## 2021-04-26 DIAGNOSIS — Z125 Encounter for screening for malignant neoplasm of prostate: Secondary | ICD-10-CM | POA: Diagnosis not present

## 2021-04-26 DIAGNOSIS — E785 Hyperlipidemia, unspecified: Secondary | ICD-10-CM | POA: Diagnosis not present

## 2021-04-26 DIAGNOSIS — Z1329 Encounter for screening for other suspected endocrine disorder: Secondary | ICD-10-CM

## 2021-04-26 DIAGNOSIS — Z1211 Encounter for screening for malignant neoplasm of colon: Secondary | ICD-10-CM

## 2021-04-26 DIAGNOSIS — I10 Essential (primary) hypertension: Secondary | ICD-10-CM

## 2021-04-26 DIAGNOSIS — Z1159 Encounter for screening for other viral diseases: Secondary | ICD-10-CM | POA: Diagnosis not present

## 2021-04-26 DIAGNOSIS — N529 Male erectile dysfunction, unspecified: Secondary | ICD-10-CM

## 2021-04-26 DIAGNOSIS — E782 Mixed hyperlipidemia: Secondary | ICD-10-CM

## 2021-04-26 DIAGNOSIS — Z0000 Encounter for general adult medical examination without abnormal findings: Secondary | ICD-10-CM

## 2021-04-26 DIAGNOSIS — Z13228 Encounter for screening for other metabolic disorders: Secondary | ICD-10-CM

## 2021-04-26 DIAGNOSIS — Z130 Encounter for screening for diseases of the blood and blood-forming organs and certain disorders involving the immune mechanism: Secondary | ICD-10-CM | POA: Diagnosis not present

## 2021-04-26 LAB — LIPID PANEL
Cholesterol: 227 mg/dL — ABNORMAL HIGH (ref 0–200)
HDL: 42.9 mg/dL (ref >39.00)
NonHDL: 184.04
Total CHOL/HDL Ratio: 5
Triglycerides: 374 mg/dL — ABNORMAL HIGH (ref 0.0–149.0)
VLDL: 74.8 mg/dL — ABNORMAL HIGH (ref 0.0–40.0)

## 2021-04-26 LAB — PSA: PSA: 0.41 ng/mL (ref 0.10–4.00)

## 2021-04-26 LAB — COMPREHENSIVE METABOLIC PANEL
ALT: 24 U/L (ref 0–53)
AST: 22 U/L (ref 0–37)
Albumin: 4.3 g/dL (ref 3.5–5.2)
Alkaline Phosphatase: 50 U/L (ref 39–117)
BUN: 24 mg/dL — ABNORMAL HIGH (ref 6–23)
CO2: 27 mEq/L (ref 19–32)
Calcium: 9.6 mg/dL (ref 8.4–10.5)
Chloride: 102 mEq/L (ref 96–112)
Creatinine, Ser: 0.9 mg/dL (ref 0.40–1.50)
GFR: 99 mL/min (ref >60.00)
Glucose, Bld: 105 mg/dL — ABNORMAL HIGH (ref 70–99)
Potassium: 4.2 mEq/L (ref 3.5–5.1)
Sodium: 139 mEq/L (ref 135–145)
Total Bilirubin: 0.6 mg/dL (ref 0.2–1.2)
Total Protein: 6.9 g/dL (ref 6.0–8.3)

## 2021-04-26 LAB — LDL CHOLESTEROL, DIRECT: Direct LDL: 120 mg/dL

## 2021-04-26 LAB — HEMOGLOBIN A1C: Hgb A1c MFr Bld: 5.9 % (ref 4.6–6.5)

## 2021-04-26 MED ORDER — LISINOPRIL 20 MG PO TABS: 20.0000 mg | ORAL_TABLET | Freq: Every day | ORAL | 2 refills | Status: DC

## 2021-04-26 MED ORDER — ATORVASTATIN CALCIUM 40 MG PO TABS
40.0000 mg | ORAL_TABLET | Freq: Every day | ORAL | 3 refills | Status: DC
Start: 2021-04-26 — End: 2022-08-29

## 2021-04-26 MED ORDER — SILDENAFIL CITRATE 20 MG PO TABS
50.0000 mg | ORAL_TABLET | Freq: Every day | ORAL | 2 refills | Status: DC | PRN
Start: 2021-04-26 — End: 2022-05-20

## 2021-04-26 NOTE — Assessment & Plan Note (Signed)
BP adequately controlled. Continue Lisinopril 20 mg daily and low salt diet. Possible complications of elevated BP discussed. Eye exam is current. Monitor BP regularly.

## 2021-04-26 NOTE — Progress Notes (Signed)
HPI:  Charles Rowe is a 50 y.o.male here today for his routine physical examination and follow up.  Last CPE: 07/25/17 He lives with his wife.  Regular exercise 3 or more times per week: He goes to the gym 2-3 times per week, spinning classes. Following a healthy diet: Cooks at home most of the time, plenty of vegetables. "Too much red meat." Sleeps about 7 hours.  Chronic medical problems: Hypertension, hyperlipidemia, OA,and ED among some. No new problems sine his last visit.  Immunization History  Administered Date(s) Administered   Influenza Whole 10/05/2010   Tdap 10/10/2013   -Hep C screening: Never. Last colon cancer screening: Never. Last prostate ca screening: Never. Nocturia x 0-1 stable for years.  Negative for high alcohol intake. Former smoker,light smoker.  -Concerns and/or follow up today:   HTN: Currently he is on Lisinopril 20 mg daily. Negative for CP,SOB,or focal weakness. BP readings 120-130/70-80.Last eye exam a year ago.  Lab Results  Component Value Date   CREATININE 0.82 10/21/2019   BUN 25 (H) 10/21/2019   NA 140 10/21/2019   K 3.8 10/21/2019   CL 101 10/21/2019   CO2 29 10/21/2019   HLD: Resumed Atorvastatin about 3 weeks ago after been off for a month. Tolerating medication well.  Lab Results  Component Value Date   CHOL 169 07/25/2017   HDL 55.40 07/25/2017   LDLCALC 102 (H) 07/25/2017   LDLDIRECT 129.0 10/12/2015   TRIG 55.0 07/25/2017   CHOLHDL 3 07/25/2017   ED: He is on Sildenafil 20 mg. He uses medication occasionally. No side effects reported.  Review of Systems  Constitutional:  Negative for activity change, appetite change, fatigue and fever.  HENT:  Negative for nosebleeds, sore throat, trouble swallowing and voice change.   Eyes:  Negative for redness and visual disturbance.  Respiratory:  Negative for cough and wheezing.   Cardiovascular:  Negative for palpitations and leg swelling.  Gastrointestinal:   Negative for abdominal pain, blood in stool, nausea and vomiting.  Endocrine: Negative for cold intolerance, heat intolerance, polydipsia, polyphagia and polyuria.  Genitourinary:  Negative for decreased urine volume, dysuria, genital sores, hematuria and testicular pain.  Musculoskeletal:  Positive for arthralgias. Negative for back pain, joint swelling and myalgias.  Skin:  Negative for color change and rash.  Allergic/Immunologic: Negative for environmental allergies.  Neurological:  Negative for syncope and headaches.  Hematological:  Negative for adenopathy. Does not bruise/bleed easily.  Psychiatric/Behavioral:  Negative for confusion and sleep disturbance. The patient is not nervous/anxious.   All other systems reviewed and are negative. No current outpatient medications on file prior to visit.   No current facility-administered medications on file prior to visit.   Past Medical History:  Diagnosis Date   Hyperlipidemia    Hypertension    Past Surgical History:  Procedure Laterality Date   GALLBLADDER SURGERY     No Known Allergies  Family History  Problem Relation Age of Onset   Cancer Father        brain    Social History   Socioeconomic History   Marital status: Married    Spouse name: Not on file   Number of children: Not on file   Years of education: Not on file   Highest education level: Not on file  Occupational History   Not on file  Tobacco Use   Smoking status: Former    Pack years: 0.00    Types: Cigarettes, Cigars    Start date:  07/22/2014   Smokeless tobacco: Never   Tobacco comments:    1 cigarette a month  Substance and Sexual Activity   Alcohol use: Yes    Alcohol/week: 0.0 standard drinks   Drug use: No   Sexual activity: Not on file  Other Topics Concern   Not on file  Social History Narrative   Not on file   Social Determinants of Health   Financial Resource Strain: Not on file  Food Insecurity: Not on file  Transportation Needs:  Not on file  Physical Activity: Not on file  Stress: Not on file  Social Connections: Not on file   Vitals:   04/26/21 0731  BP: 124/70  Pulse: 90  Resp: 16  SpO2: 98%   Body mass index is 32.61 kg/m.  Wt Readings from Last 3 Encounters:  04/26/21 214 lb 8 oz (97.3 kg)  07/23/19 206 lb 8 oz (93.7 kg)  07/25/17 190 lb 12.8 oz (86.5 kg)   Physical Exam Vitals and nursing note reviewed.  Constitutional:      General: He is not in acute distress.    Appearance: He is well-developed.  HENT:     Head: Normocephalic and atraumatic.     Right Ear: Tympanic membrane, ear canal and external ear normal.     Left Ear: Tympanic membrane, ear canal and external ear normal.     Mouth/Throat:     Mouth: Mucous membranes are moist.     Pharynx: Oropharynx is clear.  Eyes:     Extraocular Movements: Extraocular movements intact.     Conjunctiva/sclera: Conjunctivae normal.     Pupils: Pupils are equal, round, and reactive to light.  Neck:     Thyroid: No thyromegaly.     Trachea: No tracheal deviation.  Cardiovascular:     Rate and Rhythm: Normal rate and regular rhythm.     Pulses:          Dorsalis pedis pulses are 2+ on the right side and 2+ on the left side.     Heart sounds: No murmur heard. Pulmonary:     Effort: Pulmonary effort is normal. No respiratory distress.     Breath sounds: Normal breath sounds.  Chest:  Breasts:    Right: No supraclavicular adenopathy.     Left: No supraclavicular adenopathy.  Abdominal:     Palpations: Abdomen is soft. There is no hepatomegaly or mass.     Tenderness: There is no abdominal tenderness.  Genitourinary:    Comments: No concerns. Musculoskeletal:        General: No tenderness.     Cervical back: Normal range of motion.     Comments: No major deformities appreciated and no signs of synovitis.  Lymphadenopathy:     Cervical: No cervical adenopathy.     Upper Body:     Right upper body: No supraclavicular adenopathy.     Left  upper body: No supraclavicular adenopathy.  Skin:    General: Skin is warm.     Findings: No erythema.  Neurological:     General: No focal deficit present.     Mental Status: He is alert and oriented to person, place, and time.     Cranial Nerves: No cranial nerve deficit.     Sensory: No sensory deficit.     Coordination: Coordination normal.     Gait: Gait normal.     Deep Tendon Reflexes:     Reflex Scores:      Bicep reflexes are 2+  on the right side and 2+ on the left side.      Patellar reflexes are 2+ on the right side and 2+ on the left side.  ASSESSMENT AND PLAN:  CharlesCoe was seen today for annual exam and follow-up.  Diagnoses and all orders for this visit: Orders Placed This Encounter  Procedures   Hepatitis C antibody screen   Hemoglobin A1c   Lipid panel   PSA(Must document that pt has been informed of limitations of PSA testing.)   Comprehensive metabolic panel   Cologuard   LDL cholesterol, direct   Lab Results  Component Value Date   CHOL 227 (H) 04/26/2021   HDL 42.90 04/26/2021   LDLCALC 102 (H) 07/25/2017   LDLDIRECT 120.0 04/26/2021   TRIG 374.0 (H) 04/26/2021   CHOLHDL 5 04/26/2021   Lab Results  Component Value Date   PSA 0.41 04/26/2021   PSA 0.51 07/25/2017    Lab Results  Component Value Date   CREATININE 0.90 04/26/2021   BUN 24 (H) 04/26/2021   NA 139 04/26/2021   K 4.2 04/26/2021   CL 102 04/26/2021   CO2 27 04/26/2021   Lab Results  Component Value Date   ALT 24 04/26/2021   AST 22 04/26/2021   ALKPHOS 50 04/26/2021   BILITOT 0.6 04/26/2021   Lab Results  Component Value Date   HGBA1C 5.9 04/26/2021    Routine general medical examination at a health care facility We discussed the importance of regular physical activity and healthy diet for prevention of chronic illness and/or complications. Preventive guidelines reviewed. Vaccination up to date.  Next CPE in a year.  The 10-year ASCVD risk score Denman George(Goff DC Jr.,  et al., 2013) is: 5.9%   Values used to calculate the score:     Age: [age] years     Sex: Male     Is Non-Hispanic African American: No     Diabetic: No     Tobacco smoker: No     Systolic Blood Pressure: 124 mmHg     Is BP treated: Yes     HDL Cholesterol: 42.9 mg/dL     Total Cholesterol: 227 mg/dL  Encounter for HCV screening test for low risk patient -     Hepatitis C antibody screen  Prostate cancer screening -     PSA(Must document that pt has been informed of limitations of PSA testing.)  Colon cancer screening -     Cologuard  Screening for endocrine, metabolic and immunity disorder -     Comprehensive metabolic panel -     Hemoglobin A1c  Erectile dysfunction, unspecified erectile dysfunction type Tolerating medications well. Continue Sildenafil 40-100 mg daily prn.  -     sildenafil (REVATIO) 20 MG tablet; Take 2.5 tablets (50 mg total) by mouth daily as needed. 2.  Daily as directed  Essential hypertension, benign BP adequately controlled. Continue Lisinopril 20 mg daily and low salt diet. Possible complications of elevated BP discussed. Eye exam is current. Monitor BP regularly.  Mixed hyperlipidemia Continue Atorvastatin 40 mg daily. Further recommendations according to FLP result.   Return in 6 months (on 10/26/2021) for HTN.   Kron Everton G. [place], MD  [facility]. [place] office.  A few things to remember from today's visit:  Routine general medical examination at a health care facility  Encounter for HCV screening test for low risk patient - Plan: Hepatitis C antibody screen  Dyslipidemia - Plan: Lipid panel  Prostate cancer screening - Plan:  PSA(Must document that pt has been informed of limitations of PSA testing.)  Colon cancer screening - Plan: Cologuard  Screening for endocrine, metabolic and immunity disorder - Plan: Hemoglobin A1c, Comprehensive metabolic panel  If you need refills please call your pharmacy. Do not use My  Chart to request refills or for acute issues that need immediate attention.   Please be sure medication list is accurate. If a new problem present, please set up appointment sooner than planned today.  At least 150 minutes of moderate exercise per week, daily brisk walking for 15-30 min is a good exercise option. Healthy diet low in saturated (animal) fats and sweets and consisting of fresh fruits and vegetables, lean meats such as fish and white chicken and whole grains.  - Vaccines:  Tdap vaccine every 10 years.  Shingles vaccine recommended at age 42, could be given after 51 years of age but not sure about insurance coverage.  Pneumonia vaccines: Pneumovax at 40  -Screening recommendations for low/normal risk males:  Screening for diabetes at age 46 and every 3 years. Earlier screening if cardiovascular risk factors.  Lipid screening at 35 and every 3 years. Screening starts in younger males with cardiovascular risk factors.N/A  Colon cancer screening is now at age 49 but your insurance may not cover until age 54 .screening is recommended age 65.  Prostate cancer screening: some controversy, starts usually at 50: Rectal exam and PSA.  Aortic Abdominal Aneurism once between 8 and 77 years old if ever smoker.  Also recommended:  Dental visit- Brush and floss your teeth twice daily; visit your dentist twice a year. Eye doctor- Get an eye exam at least every 2 years. Helmet use- Always wear a helmet when riding a bicycle, motorcycle, rollerblading or skateboarding. Safe sex- If you may be exposed to sexually transmitted infections, use a condom. Seat belts- Seat belts can save your live; always wear one. Smoke/Carbon Monoxide detectors- These detectors need to be installed on the appropriate level of your home. Replace batteries at least once a year. Skin cancer- When out in the sun please cover up and use sunscreen 15 SPF or higher. Violence- If anyone is threatening or hurting  you, please tell your healthcare provider.  Drink alcohol in moderation- Limit alcohol intake to one drink or less per day. Never drink and drive.

## 2021-04-26 NOTE — Patient Instructions (Addendum)
A few things to remember from today's visit:  Routine general medical examination at a health care facility  Encounter for HCV screening test for low risk patient - Plan: Hepatitis C antibody screen  Dyslipidemia - Plan: Lipid panel  Prostate cancer screening - Plan: PSA(Must document that pt has been informed of limitations of PSA testing.)  Colon cancer screening - Plan: Cologuard  Screening for endocrine, metabolic and immunity disorder - Plan: Hemoglobin A1c, Comprehensive metabolic panel  If you need refills please call your pharmacy. Do not use My Chart to request refills or for acute issues that need immediate attention.   Please be sure medication list is accurate. If a new problem present, please set up appointment sooner than planned today.  At least 150 minutes of moderate exercise per week, daily brisk walking for 15-30 min is a good exercise option. Healthy diet low in saturated (animal) fats and sweets and consisting of fresh fruits and vegetables, lean meats such as fish and white chicken and whole grains.  - Vaccines:  Tdap vaccine every 10 years.  Shingles vaccine recommended at age 23, could be given after 51 years of age but not sure about insurance coverage.  Pneumonia vaccines: Pneumovax at 36  -Screening recommendations for low/normal risk males:  Screening for diabetes at age 45 and every 3 years. Earlier screening if cardiovascular risk factors.  Lipid screening at 35 and every 3 years. Screening starts in younger males with cardiovascular risk factors.N/A  Colon cancer screening is now at age 74 but your insurance may not cover until age 89 .screening is recommended age 14.  Prostate cancer screening: some controversy, starts usually at 50: Rectal exam and PSA.  Aortic Abdominal Aneurism once between 31 and 17 years old if ever smoker.  Also recommended:  1. Dental visit- Brush and floss your teeth twice daily; visit your dentist twice a  year. 2. Eye doctor- Get an eye exam at least every 2 years. 3. Helmet use- Always wear a helmet when riding a bicycle, motorcycle, rollerblading or skateboarding. 4. Safe sex- If you may be exposed to sexually transmitted infections, use a condom. 5. Seat belts- Seat belts can save your live; always wear one. 6. Smoke/Carbon Monoxide detectors- These detectors need to be installed on the appropriate level of your home. Replace batteries at least once a year. 7. Skin cancer- When out in the sun please cover up and use sunscreen 15 SPF or higher. 8. Violence- If anyone is threatening or hurting you, please tell your healthcare provider.  9. Drink alcohol in moderation- Limit alcohol intake to one drink or less per day. Never drink and drive.

## 2021-04-26 NOTE — Assessment & Plan Note (Signed)
Continue Atorvastatin 40 mg daily. Further recommendations according to FLP result. 

## 2021-04-27 LAB — HEPATITIS C ANTIBODY
Hepatitis C Ab: NONREACTIVE
SIGNAL TO CUT-OFF: 0 (ref <1.00)

## 2021-12-18 ENCOUNTER — Other Ambulatory Visit: Payer: Self-pay | Admitting: Family Medicine

## 2022-02-28 ENCOUNTER — Ambulatory Visit (INDEPENDENT_AMBULATORY_CARE_PROVIDER_SITE_OTHER): Payer: BC Managed Care – PPO | Admitting: Family Medicine

## 2022-02-28 ENCOUNTER — Encounter: Payer: Self-pay | Admitting: Family Medicine

## 2022-02-28 VITALS — BP 120/80 | HR 79 | Temp 99.1°F | Resp 16 | Ht 68.0 in | Wt 222.1 lb

## 2022-02-28 DIAGNOSIS — M79644 Pain in right finger(s): Secondary | ICD-10-CM | POA: Diagnosis not present

## 2022-02-28 DIAGNOSIS — M654 Radial styloid tenosynovitis [de Quervain]: Secondary | ICD-10-CM

## 2022-02-28 NOTE — Progress Notes (Signed)
? ? ?ACUTE VISIT ?Chief Complaint  ?Patient presents with  ? Hand Pain  ?  Stiffness & pain in right hand when moving thumb; going up into arm when writing. Ongoing for 3-4 months, only on the right side.   ? ?HPI: ?Charles Rowe is a right-handed 51 y.o. male with history of hypertension and hyperlipidemia here today complaining of right thumb pain as described above. ?No hx of trauma. ?Rather than pain it is " uncomfortable." ?Stiffness in the morning. ?Sometimes more like mild burning sensation radiated to radial aspect of wrist and mis forearm. ? ?No other joint pain. ? ?Hand Pain  ?There was no injury mechanism. The quality of the pain is described as aching and burning. The pain does not radiate. The pain is at a severity of 5/10. The pain is mild. The pain has been Intermittent since the incident. Pertinent negatives include no chest pain, muscle weakness, numbness or tingling. The symptoms are aggravated by movement. He has tried nothing for the symptoms. The treatment provided no relief.  ?He has not noted edema or erythema. ?Pain is exacerbated by certain activities like taking car gas cap off, scrolling down or up with thumb on phone screen, signing, and holding tooth brush among some. ?Alleviated by rest. ?Problem has been stable. ? ?Review of Systems  ?Constitutional:  Negative for activity change, appetite change, chills and fever.  ?Respiratory:  Negative for shortness of breath.   ?Cardiovascular:  Negative for chest pain.  ?Musculoskeletal:  Negative for myalgias and neck pain.  ?Skin:  Negative for pallor and rash.  ?Neurological:  Negative for tingling and numbness.  ?Rest see pertinent positives and negatives per HPI. ? ?Current Outpatient Medications on File Prior to Visit  ?Medication Sig Dispense Refill  ? atorvastatin (LIPITOR) 40 MG tablet Take 1 tablet (40 mg total) by mouth daily. 90 tablet 3  ? lisinopril (ZESTRIL) 20 MG tablet TAKE 1 TABLET(20 MG) BY MOUTH DAILY 90 tablet 2  ?  sildenafil (REVATIO) 20 MG tablet Take 2.5 tablets (50 mg total) by mouth daily as needed. 2.  Daily as directed 90 tablet 2  ? ?No current facility-administered medications on file prior to visit.  ? ? ? ?Past Medical History:  ?Diagnosis Date  ? Hyperlipidemia   ? Hypertension   ? ?No Known Allergies ? ?Social History  ? ?Socioeconomic History  ? Marital status: Married  ?  Spouse name: Not on file  ? Number of children: Not on file  ? Years of education: Not on file  ? Highest education level: Not on file  ?Occupational History  ? Not on file  ?Tobacco Use  ? Smoking status: Former  ?  Types: Cigarettes, Cigars  ?  Start date: 07/22/2014  ? Smokeless tobacco: Never  ? Tobacco comments:  ?  1 cigarette a month  ?Substance and Sexual Activity  ? Alcohol use: Yes  ?  Alcohol/week: 0.0 standard drinks  ? Drug use: No  ? Sexual activity: Not on file  ?Other Topics Concern  ? Not on file  ?Social History Narrative  ? Not on file  ? ?Social Determinants of Health  ? ?Financial Resource Strain: Not on file  ?Food Insecurity: Not on file  ?Transportation Needs: Not on file  ?Physical Activity: Not on file  ?Stress: Not on file  ?Social Connections: Not on file  ? ?Vitals:  ? 02/28/22 0728  ?BP: 120/80  ?Pulse: 79  ?Resp: 16  ?Temp: 99.1 ?F (37.3 ?C)  ?SpO2:  97%  ? ?Body mass index is 33.77 kg/m?. ? ?Physical Exam ?Vitals and nursing note reviewed.  ?HENT:  ?   Head: Normocephalic and atraumatic.  ?Eyes:  ?   Conjunctiva/sclera: Conjunctivae normal.  ?Cardiovascular:  ?   Rate and Rhythm: Normal rate and regular rhythm.  ?   Pulses:     ?     Radial pulses are 2+ on the right side.  ?   Heart sounds: No murmur heard. ?Pulmonary:  ?   Effort: Pulmonary effort is normal. No respiratory distress.  ?   Breath sounds: Normal breath sounds.  ?Musculoskeletal:  ?   Right hand: No deformity, tenderness or bony tenderness. Normal range of motion. There is no disruption of two-point discrimination. Normal capillary refill. Normal  pulse.  ?   Comments: There is not tenderness upon palpation of radial styloid, no edema or erythema appreciated, no limitation of wrist ROM. Pain was not elicited on radial styloid with Finkelstein maneuver. ?  ?Neurological:  ?   Mental Status: He is alert and oriented to person, place, and time.  ?   Motor: No weakness.  ?   Gait: Gait normal.  ? ?ASSESSMENT AND PLAN: ? ?[name] was seen today for hand pain. ? ?Diagnoses and all orders for this visit: ? ?Pain of right thumb ?Mild. ?We discussed possible etiologies. ?I do not think imaging of blood work are needed today. ? ?De Quervain's tenosynovitis, right ?Hx suggest tenosynovitis, pain was not elicit with examination here today. ?Differential Dx includes OA of trapeziometacarpal joint. ?We discussed Dx, prognosis, and treatment options. ?Pain is mild, so I do not think oral antiinflammatories are recommended at this time also because hx of HTN we need to be cautious with this type of medications. ?Recommend forearm-based thumb spica splint right hand for 5-6 weeks. Rx given. ?If pain gets worse or if not any better in 4-6 weeks, he was instructed to let me know, in which case sport Medicine appt can be arranged. ?He voices understanding and agrees with plan. ? ?Return if symptoms worsen or fail to improve. ? ?Jersey Ravenscroft G. [place], MD ? ?[facility]. ?[place] office. ? ?

## 2022-02-28 NOTE — Patient Instructions (Addendum)
A few things to remember from today's visit: ? ?De Quervain's tenosynovitis, right ?If not any better in 4-6 weeks with splint let me know. ? ?If you need refills please call your pharmacy. ?Do not use My Chart to request refills or for acute issues that need immediate attention. ? De Quervain's Tenosynovitis ?De Quervain's tenosynovitis is a condition that causes inflammation of the tendon on the thumb side of the wrist. Tendons are cords of tissue that connect bones to muscles. The tendons in the hand pass through a tunnel called a sheath. A slippery layer of tissue (synovium) lets the tendons move smoothly in the sheath. With de Quervain's tenosynovitis, the sheath swells or thickens, causing friction and pain. ?The condition is also called de Quervain's disease and de Quervain's syndrome. It occurs most often in women who are 52-12 years old. ?What are the causes? ?The exact cause of this condition is not known. It may be associated with overuse of the hand and wrist. ?What increases the risk? ?You are more likely to develop this condition if you: ?Use your hands far more than normal, especially if you repeat certain movements that involve twisting your hand or using a tight grip. ?Are pregnant. ?Are a middle-aged woman. ?Have rheumatoid arthritis. ?Have diabetes. ?What are the signs or symptoms? ?The main symptom of this condition is pain on the thumb side of the wrist. The pain may get worse when you grasp something or turn your wrist. Other symptoms may include: ?Pain that extends up the forearm. ?Swelling of your wrist and hand. ?Trouble moving the thumb and wrist. ?A sensation of snapping in the wrist. ?A bump filled with fluid (cyst) in the area of the pain. ?How is this diagnosed? ?This condition may be diagnosed based on: ?Your symptoms and medical history. ?A physical exam. During the exam, your health care provider may do a simple test Lourena Simmonds test) that involves pulling your thumb and wrist to  see if this causes pain. ?You may also need to have an X-ray or ultrasound. ?How is this treated? ?Treatment for this condition may include: ?Avoiding any activity that causes pain and swelling. ?Taking medicines. Anti-inflammatory medicines and corticosteroid injections may be used to reduce inflammation and relieve pain. ?Wearing a splint. ?Having surgery. This may be needed if other treatments do not work. ?Once the pain and swelling have gone down, you may start: ?Physical therapy. This includes exercises to improve movement and strength in your wrist and thumb. ?Occupational therapy. This includes adjusting how you move your wrist. ?Follow these instructions at home: ?If you have a splint: ?Wear the splint as told by your health care provider. Remove it only as told by your health care provider. ?Loosen the splint if your fingers tingle, become numb, or turn cold and blue. ?Keep the splint clean. ?If the splint is not waterproof: ?Do not let it get wet. ?Cover it with a watertight covering when you take a bath or a shower. ?Managing pain, stiffness, and swelling ? ?Avoid movements and activities that cause pain and swelling in the wrist area. ?If directed, put ice on the painful area. This may be helpful after doing activities that involve the sore wrist. To do this: ?Put ice in a plastic bag. ?Place a towel between your skin and the bag. ?Leave the ice on for 20 minutes, 2-3 times a day. ?Remove the ice if your skin turns bright red. This is very important. If you cannot feel pain, heat, or cold, you have a  greater risk of damage to the area. ?Move your fingers often to reduce stiffness and swelling. ?Raise (elevate) the injured area above the level of your heart while you are sitting or lying down. ?General instructions ?Return to your normal activities as told by your health care provider. Ask your health care provider what activities are safe for you. ?Take over-the-counter and prescription medicines only  as told by your health care provider. ?Keep all follow-up visits. This is important. ?Contact a health care provider if: ?Your pain medicine does not help. ?Your pain gets worse. ?You develop new symptoms. ?Summary ?De Quervain's tenosynovitis is a condition that causes inflammation of the tendon on the thumb side of the wrist. ?The condition occurs most often in women who are 52-49 years old. ?The exact cause of this condition is not known. It may be associated with overuse of the hand and wrist. ?Treatment starts with avoiding activity that causes pain or swelling in the wrist area. Other treatments may include wearing a splint and taking medicine. Sometimes, surgery is needed. ?This information is not intended to replace advice given to you by your health care provider. Make sure you discuss any questions you have with your health care provider. ?Document Revised: 02/19/2020 Document Reviewed: 02/19/2020 ?Elsevier Patient Education ? 2022 [employer] ? ? ?Please be sure medication list is accurate. ?If a new problem present, please set up appointment sooner than planned today. ? ? ? ? ? ? ? ?

## 2022-05-02 DIAGNOSIS — R350 Frequency of micturition: Secondary | ICD-10-CM | POA: Diagnosis not present

## 2022-05-02 DIAGNOSIS — R300 Dysuria: Secondary | ICD-10-CM | POA: Diagnosis not present

## 2022-05-02 DIAGNOSIS — M549 Dorsalgia, unspecified: Secondary | ICD-10-CM | POA: Diagnosis not present

## 2022-05-02 DIAGNOSIS — M545 Low back pain, unspecified: Secondary | ICD-10-CM | POA: Diagnosis not present

## 2022-05-20 ENCOUNTER — Other Ambulatory Visit: Payer: Self-pay | Admitting: Family Medicine

## 2022-05-20 DIAGNOSIS — N529 Male erectile dysfunction, unspecified: Secondary | ICD-10-CM

## 2022-08-19 ENCOUNTER — Encounter: Payer: BC Managed Care – PPO | Admitting: Family Medicine

## 2022-08-25 ENCOUNTER — Ambulatory Visit
Admission: EM | Admit: 2022-08-25 | Discharge: 2022-08-25 | Disposition: A | Discharge status: Home / Self Care | Payer: BC Managed Care – PPO | Attending: Urgent Care | Admitting: Urgent Care

## 2022-08-25 DIAGNOSIS — H65112 Acute and subacute allergic otitis media (mucoid) (sanguinous) (serous), left ear: Secondary | ICD-10-CM

## 2022-08-25 MED ORDER — AMOXICILLIN 500 MG PO CAPS: 1000.0000 mg | ORAL_CAPSULE | Freq: Two times a day (BID) | ORAL | 0 refills | Status: AC

## 2022-08-25 MED ORDER — FLUTICASONE PROPIONATE 50 MCG/ACT NA SUSP: 1.0000 | Freq: Every day | NASAL | 0 refills | Status: DC

## 2022-08-25 NOTE — Discharge Instructions (Addendum)
You have mucoid otitis media on the left ear. Please start taking the amoxicillin as prescribed.  Take all 10 days to prevent rebound. Please use the Flonase to help open up your eustachian tube. Should you still have persistent symptoms after 10 days, we may consider using a prednisone taper pack.

## 2022-08-25 NOTE — ED Provider Notes (Signed)
[name]    CSN: [id number] Arrival date & time: 08/25/22  1511      History   Chief Complaint Chief Complaint  Patient presents with   Ear Fullness    HPI Charles Rowe is a 51 y.o. male.   51 year old male presents today with primary concern of left ear fullness and discomfort for the past several days.  Reports 2 and half weeks ago he had COVID.  At that time he had significant sinus pressure and nasal congestion.  He reports that this has dissipated, but has left him with left ear discomfort.  He reports it like a sloshing sound, or like fluid behind his eardrum.  He denies any pain to movement of his ear, and denies any liquid drainage from his canal.  No additional symptoms at this time.   Ear Fullness    Past Medical History:  Diagnosis Date   Hyperlipidemia    Hypertension     Patient Active Problem List   Diagnosis Date Noted   ED (erectile dysfunction) 04/26/2021   Essential hypertension, benign 10/10/2013   CHEST PAIN 03/05/2010   Mixed hyperlipidemia 02/18/2010    Past Surgical History:  Procedure Laterality Date   GALLBLADDER SURGERY         Home Medications    Prior to Admission medications   Medication Sig Start Date End Date Taking? Authorizing Provider  amoxicillin (AMOXIL) 500 MG capsule Take 2 capsules (1,000 mg total) by mouth 2 (two) times daily for 10 days. 08/25/22 09/04/22 Yes Naveed Humphres L, PA  fluticasone (FLONASE) 50 MCG/ACT nasal spray Place 1 spray into both nostrils daily. 08/25/22  Yes Emile Kyllo L, PA  atorvastatin (LIPITOR) 40 MG tablet Take 1 tablet (40 mg total) by mouth daily. 04/26/21   [place], Betty G, MD  lisinopril (ZESTRIL) 20 MG tablet TAKE 1 TABLET(20 MG) BY MOUTH DAILY 12/20/21   [place], Betty G, MD  sildenafil (REVATIO) 20 MG tablet TAKE TWO AND ONE-HALF TABLETS BY MOUTH DAILY AS NEEDED AS DIRECTED 05/20/22   [place], Betty G, MD    Family History Family History  Problem Relation Age of Onset    Cancer Father        brain    Social History Social History   Tobacco Use   Smoking status: Former    Types: Cigarettes, Cigars    Start date: 07/22/2014   Smokeless tobacco: Never   Tobacco comments:    1 cigarette a month  Substance Use Topics   Alcohol use: Yes    Alcohol/week: 0.0 standard drinks of alcohol   Drug use: No     Allergies   Patient has no known allergies.   Review of Systems Review of Systems As per HPI  Physical Exam Triage Vital Signs ED Triage Vitals  Enc Vitals Group     BP 08/25/22 1524 135/87     Pulse Rate 08/25/22 1524 77     Resp 08/25/22 1524 14     Temp 08/25/22 1524 98.6 F (37 C)     Temp Source 08/25/22 1524 Oral     SpO2 08/25/22 1524 97 %     Weight --      Height --      Head Circumference --      Peak Flow --      Pain Score 08/25/22 1525 0     Pain Loc --      Pain Edu? --      Excl. in [place]? --  No data found.  Updated Vital Signs BP 135/87 (BP Location: Left Arm)   Pulse 77   Temp 98.6 F (37 C) (Oral)   Resp 14   SpO2 97%   Visual Acuity Right Eye Distance:   Left Eye Distance:   Bilateral Distance:    Right Eye Near:   Left Eye Near:    Bilateral Near:     Physical Exam Vitals and nursing note reviewed.  Constitutional:      General: He is not in acute distress.    Appearance: Normal appearance. He is not ill-appearing, toxic-appearing or diaphoretic.  HENT:     Head: Normocephalic and atraumatic.     Jaw: There is normal jaw occlusion.     Salivary Glands: Right salivary gland is not diffusely enlarged or tender. Left salivary gland is not diffusely enlarged or tender.     Right Ear: Tympanic membrane and ear canal normal. No laceration, drainage, swelling or tenderness. No middle ear effusion. Tympanic membrane is not perforated or erythematous. Tympanic membrane has normal mobility.     Left Ear: Ear canal normal. No laceration, drainage, swelling or tenderness. A middle ear effusion (mucoid plug  posterior TM) is present. Tympanic membrane is not perforated or erythematous. Tympanic membrane has decreased mobility.     Nose: Nose normal. No congestion or rhinorrhea.     Mouth/Throat:     Mouth: Mucous membranes are moist.     Pharynx: Oropharynx is clear.  Cardiovascular:     Rate and Rhythm: Normal rate.  Pulmonary:     Effort: Pulmonary effort is normal. No respiratory distress.  Musculoskeletal:     Cervical back: Normal range of motion and neck supple. No rigidity or tenderness.  Lymphadenopathy:     Cervical: No cervical adenopathy.  Skin:    General: Skin is warm.     Findings: No erythema or rash.  Neurological:     General: No focal deficit present.     Mental Status: He is alert and oriented to person, place, and time.      UC Treatments / Results  Labs (all labs ordered are listed, but only abnormal results are displayed) Labs Reviewed - No data to display  EKG   Radiology No results found.  Procedures Procedures (including critical care time)  Medications Ordered in UC Medications - No data to display  Initial Impression / Assessment and Plan / UC Course  I have reviewed the triage vital signs and the nursing notes.  Pertinent labs & imaging results that were available during my care of the patient were reviewed by me and considered in my medical decision making (see chart for details).     Acute mucoid OM L ear - will start amoxicillin 1g BID x 10 days.  Encouraged use of Flonase daily as well to help open up eustachian tube.  Recommended follow-up PCP in 10 days to ensure complete resolution of fluid.  Final Clinical Impressions(s) / UC Diagnoses   Final diagnoses:  Acute mucoid otitis media of left ear     Discharge Instructions      You have mucoid otitis media on the left ear. Please start taking the amoxicillin as prescribed.  Take all 10 days to prevent rebound. Please use the Flonase to help open up your eustachian tube. Should  you still have persistent symptoms after 10 days, we may consider using a prednisone taper pack.     ED Prescriptions     Medication Sig Dispense Auth.  Provider   fluticasone (FLONASE) 50 MCG/ACT nasal spray Place 1 spray into both nostrils daily. 16 mL Nichalos Brenton L, PA   amoxicillin (AMOXIL) 500 MG capsule Take 2 capsules (1,000 mg total) by mouth 2 (two) times daily for 10 days. 40 capsule Noah Lembke L, PA      PDMP not reviewed this encounter.   Maretta Bees, [place] 08/25/22 1637

## 2022-08-25 NOTE — ED Triage Notes (Signed)
Pt presents with c/o left ear fullness

## 2022-08-29 ENCOUNTER — Encounter: Payer: Self-pay | Admitting: Family Medicine

## 2022-08-29 ENCOUNTER — Other Ambulatory Visit: Payer: Self-pay | Admitting: Family Medicine

## 2022-08-29 DIAGNOSIS — E782 Mixed hyperlipidemia: Secondary | ICD-10-CM

## 2022-08-29 MED ORDER — ATORVASTATIN CALCIUM 40 MG PO TABS: 40.0000 mg | ORAL_TABLET | Freq: Every day | ORAL | 0 refills | Status: DC

## 2022-08-29 MED ORDER — LISINOPRIL 20 MG PO TABS: ORAL_TABLET | ORAL | 0 refills | Status: DC

## 2022-08-30 NOTE — Progress Notes (Unsigned)
HPI: [name] is a 51 y.o.male with history of hyperlipidemia, hypertension, and ED here today for his routine physical examination.  Last CPE: 04/26/21  He exercises regularly, attending spin class three days a week and body sculpting two days a week for the past 10 years. He believes his diet is relatively healthy, with daily vegetable consumption, although he admits to eating more meat than recommended. His weight has remained stable since his last visit.  Immunization History  Administered Date(s) Administered   Influenza Whole 10/05/2010   Tdap 10/10/2013   Health Maintenance  Topic Date Due   COLONOSCOPY (Pts 45-35yrs Insurance coverage will need to be confirmed)  Never done   Zoster Vaccines- Shingrix (1 of 2) Never done   COVID-19 Vaccine (1) 09/16/2022 (Originally 06/19/1971)   INFLUENZA VACCINE  02/19/2023 (Originally 06/21/2022)   HIV Screening  04/26/2026 (Originally 12/20/1984)   TETANUS/TDAP  10/11/2023   Hepatitis C Screening  Completed   HPV VACCINES  Aged Out   Last prostate ca screening: PSA 0.4 in 04/2021. Nocturia x1, stable for years.  He smoked a few years ago, 1 cigarette every month, less than 100 cigarettes/year. Negative for high alcohol intake.  HTN and HLD: The patient has been monitoring his blood pressure regularly, with readings typically around 130/80 mmHg but occasionally reaching 140/80 mmHg.   He is currently taking Atorvastatin 40 mg daily  for HLD and Lisinopril 20 mg daily for HTN. Lab Results  Component Value Date   CREATININE 0.90 04/26/2021   BUN 24 (H) 04/26/2021   NA 139 04/26/2021   K 4.2 04/26/2021   CL 102 04/26/2021   CO2 27 04/26/2021   Lab Results  Component Value Date   CHOL 227 (H) 04/26/2021   HDL 42.90 04/26/2021   LDLCALC 102 (H) 07/25/2017   LDLDIRECT 120.0 04/26/2021   TRIG 374.0 (H) 04/26/2021   CHOLHDL 5 04/26/2021   Lab Results  Component Value Date   HGBA1C 5.9 04/26/2021   He reports a recent  increase in snoring and "labored breathing" during sleep, as observed by his wife. Despite this, he denies experiencing morning headaches and states that he feels rested when waking up. He sleeps an average of seven hours per night. Occasionally he feels fatigued in the late afternoon.  Concerned about hyperpigmented skin lesion on his left cheek that has been present for one to two years and has grown slightly larger and darker. He also has a small skin tag near his waist that he would like removed. He has a family history of melanoma, as his grandfather was diagnosed with the condition.  Review of Systems  Constitutional:  Negative for activity change, appetite change and fever.  HENT:  Negative for dental problem, nosebleeds, sore throat and trouble swallowing.   Eyes:  Negative for redness and visual disturbance.  Respiratory:  Negative for cough, shortness of breath and wheezing.   Cardiovascular:  Negative for chest pain, palpitations and leg swelling.  Gastrointestinal:  Negative for abdominal pain, blood in stool, nausea and vomiting.       No changes in bowel habits.  Endocrine: Negative for cold intolerance, heat intolerance, polydipsia, polyphagia and polyuria.  Genitourinary:  Negative for decreased urine volume, dysuria, genital sores, hematuria and testicular pain.  Musculoskeletal:  Negative for gait problem and myalgias.  Skin:  Negative for wound.  Allergic/Immunologic: Negative for environmental allergies.  Neurological:  Negative for syncope, weakness, numbness and headaches.  Hematological:  Negative for adenopathy. Does  not bruise/bleed easily.  Psychiatric/Behavioral:  Negative for confusion and sleep disturbance. The patient is not nervous/anxious.   All other systems reviewed and are negative.  Current Outpatient Medications on File Prior to Visit  Medication Sig Dispense Refill   amoxicillin (AMOXIL) 500 MG capsule Take 2 capsules (1,000 mg total) by mouth 2 (two)  times daily for 10 days. 40 capsule 0   atorvastatin (LIPITOR) 40 MG tablet TAKE 1 TABLET(40 MG) BY MOUTH DAILY 90 tablet 0   lisinopril (ZESTRIL) 20 MG tablet TAKE 1 TABLET(20 MG) BY MOUTH DAILY 90 tablet 0   sildenafil (REVATIO) 20 MG tablet TAKE TWO AND ONE-HALF TABLETS BY MOUTH DAILY AS NEEDED AS DIRECTED 90 tablet 0   No current facility-administered medications on file prior to visit.   Past Medical History:  Diagnosis Date   Hyperlipidemia    Hypertension    Past Surgical History:  Procedure Laterality Date   GALLBLADDER SURGERY     No Known Allergies  Family History  Problem Relation Age of Onset   Cancer Father        brain   Social History   Socioeconomic History   Marital status: Married    Spouse name: Not on file   Number of children: Not on file   Years of education: Not on file   Highest education level: Not on file  Occupational History   Not on file  Tobacco Use   Smoking status: Former    Types: Cigarettes, Cigars    Start date: 07/22/2014   Smokeless tobacco: Never   Tobacco comments:    1 cigarette a month  Substance and Sexual Activity   Alcohol use: Yes    Alcohol/week: 0.0 standard drinks of alcohol   Drug use: No   Sexual activity: Not on file  Other Topics Concern   Not on file  Social History Narrative   Not on file   Social Determinants of Health   Financial Resource Strain: Not on file  Food Insecurity: Not on file  Transportation Needs: Not on file  Physical Activity: Not on file  Stress: Not on file  Social Connections: Not on file   Vitals:   08/31/22 0831  BP: 128/80  Pulse: 100  Resp: 12  Temp: 97.8 F (36.6 C)  SpO2: 96%  Body mass index is 32.9 kg/m. Wt Readings from Last 3 Encounters:  08/31/22 216 lb 6 oz (98.1 kg)  02/28/22 222 lb 2 oz (100.8 kg)  04/26/21 214 lb 8 oz (97.3 kg)  Physical Exam Vitals and nursing note reviewed.  Constitutional:      General: He is not in acute distress.    Appearance: He is  well-developed and well-groomed.  HENT:     Head: Normocephalic and atraumatic.     Right Ear: Tympanic membrane, ear canal and external ear normal.     Left Ear: Tympanic membrane, ear canal and external ear normal.     Mouth/Throat:     Mouth: Mucous membranes are moist.     Pharynx: Oropharynx is clear.  Eyes:     Extraocular Movements: Extraocular movements intact.     Conjunctiva/sclera: Conjunctivae normal.     Pupils: Pupils are equal, round, and reactive to light.  Neck:     Thyroid: No thyroid mass.  Cardiovascular:     Rate and Rhythm: Normal rate and regular rhythm.     Pulses:          Dorsalis pedis pulses are 2+ on  the right side and 2+ on the left side.     Heart sounds: No murmur heard. Pulmonary:     Effort: Pulmonary effort is normal. No respiratory distress.     Breath sounds: Normal breath sounds.  Abdominal:     Palpations: Abdomen is soft. There is no hepatomegaly or mass.     Tenderness: There is no abdominal tenderness.  Genitourinary:    Comments: No concerns. Musculoskeletal:        General: No tenderness.     Cervical back: Normal range of motion.     Comments: No major deformities appreciated and no signs of synovitis.  Lymphadenopathy:     Cervical: No cervical adenopathy.     Upper Body:     Right upper body: No supraclavicular adenopathy.     Left upper body: No supraclavicular adenopathy.  Skin:    General: Skin is warm.     Findings: No erythema.       Neurological:     General: No focal deficit present.     Mental Status: He is alert and oriented to person, place, and time.     Cranial Nerves: No cranial nerve deficit.     Sensory: No sensory deficit.     Motor: Motor function is intact.     Gait: Gait normal.     Deep Tendon Reflexes:     Reflex Scores:      Bicep reflexes are 2+ on the right side and 2+ on the left side.      Patellar reflexes are 2+ on the right side and 2+ on the left side. Psychiatric:        Mood and Affect:  Mood and affect normal.   ASSESSMENT AND PLAN:  Mr.Deanna was seen today for annual exam and follow-up.  Diagnoses and all orders for this visit: Orders Placed This Encounter  Procedures   Cologuard   PSA(Must document that pt has been informed of limitations of PSA testing.)   Comprehensive metabolic panel   Hemoglobin A1c   Lipid panel   TSH   Ambulatory referral to Sleep Studies   Ambulatory referral to Dermatology   Lab Results  Component Value Date   HGBA1C 6.0 08/31/2022    PSA 0.39 08/31/2022   Lab Results  Component Value Date   ALT 24 08/31/2022   AST 20 08/31/2022   ALKPHOS 52 08/31/2022   BILITOT 0.8 08/31/2022   Lab Results  Component Value Date   CREATININE 0.93 08/31/2022   BUN 29 (H) 08/31/2022   NA 137 08/31/2022   K 4.5 08/31/2022   CL 100 08/31/2022   CO2 27 08/31/2022   Lab Results  Component Value Date   CHOL 201 (H) 08/31/2022   HDL 51.10 08/31/2022   LDLCALC 117 (H) 08/31/2022   LDLDIRECT 120.0 04/26/2021   TRIG 162.0 (H) 08/31/2022   CHOLHDL 4 08/31/2022   Routine general medical examination at a health care facility We discussed the importance of regular physical activity and healthy diet for prevention of chronic illness and/or complications. Preventive guidelines reviewed. Vaccination: Declined vaccination Cologuard ordered for colon cancer screening. Next CPE in a year. The 10-year ASCVD risk score (Arnett DK, et al., 2019) is: 4.9%   Values used to calculate the score:     Age: 41 years     Sex: Male     Is Non-Hispanic African American: No     Diabetic: No     Tobacco smoker: No  Systolic Blood Pressure: 128 mmHg     Is BP treated: Yes     HDL Cholesterol: 51.1 mg/dL     Total Cholesterol: 201 mg/dL  Prostate cancer screening -     PSA(Must document that pt has been informed of limitations of PSA testing.)  Loud snoring We discussed possible etiologies. Weight loss may help as well as sleeping on the side.  He is  concerned about possible sleep apnea, referral for sleep study placed.  Hyperpigmented skin lesion Reassured, lesion on left cheek has characteristics of seborrheic keratosis. He would like a skin tag removed, so appointment with dermatology will be arranged.  Colon cancer screening -     Cologuard  Screening for endocrine, metabolic and immunity disorder -     Hemoglobin A1c -     Comprehensive metabolic panel  Essential hypertension, benign Today here in the office BP is adequately controlled. He is reporting some SBP mildly elevated at home, 140. For now no changes in lisinopril dose but if SBP is frequently=>140, we will need to adjust treatment. Continue low-salt diet.  Mixed hyperlipidemia Continue atorvastatin 40 mg daily and low-fat diet. Further recommendation will be given according to lipid panel result.  Return in 1 year (on 09/01/2023) for CPE and f/u.  Teleshia Lemere G. [place], MD  [facility]. [place] office.

## 2022-08-31 ENCOUNTER — Encounter: Payer: Self-pay | Admitting: Family Medicine

## 2022-08-31 ENCOUNTER — Ambulatory Visit (INDEPENDENT_AMBULATORY_CARE_PROVIDER_SITE_OTHER): Payer: BC Managed Care – PPO | Admitting: Family Medicine

## 2022-08-31 VITALS — BP 128/80 | HR 100 | Temp 97.8°F | Resp 12 | Ht 68.0 in | Wt 216.4 lb

## 2022-08-31 DIAGNOSIS — R0683 Snoring: Secondary | ICD-10-CM | POA: Diagnosis not present

## 2022-08-31 DIAGNOSIS — Z0000 Encounter for general adult medical examination without abnormal findings: Secondary | ICD-10-CM | POA: Diagnosis not present

## 2022-08-31 DIAGNOSIS — Z13228 Encounter for screening for other metabolic disorders: Secondary | ICD-10-CM | POA: Diagnosis not present

## 2022-08-31 DIAGNOSIS — Z125 Encounter for screening for malignant neoplasm of prostate: Secondary | ICD-10-CM

## 2022-08-31 DIAGNOSIS — E782 Mixed hyperlipidemia: Secondary | ICD-10-CM | POA: Diagnosis not present

## 2022-08-31 DIAGNOSIS — Z130 Encounter for screening for diseases of the blood and blood-forming organs and certain disorders involving the immune mechanism: Secondary | ICD-10-CM

## 2022-08-31 DIAGNOSIS — Z1329 Encounter for screening for other suspected endocrine disorder: Secondary | ICD-10-CM

## 2022-08-31 DIAGNOSIS — I10 Essential (primary) hypertension: Secondary | ICD-10-CM | POA: Diagnosis not present

## 2022-08-31 DIAGNOSIS — L819 Disorder of pigmentation, unspecified: Secondary | ICD-10-CM

## 2022-08-31 DIAGNOSIS — Z1211 Encounter for screening for malignant neoplasm of colon: Secondary | ICD-10-CM

## 2022-08-31 LAB — COMPREHENSIVE METABOLIC PANEL
ALT: 24 U/L (ref 0–53)
AST: 20 U/L (ref 0–37)
Albumin: 4.4 g/dL (ref 3.5–5.2)
Alkaline Phosphatase: 52 U/L (ref 39–117)
BUN: 29 mg/dL — ABNORMAL HIGH (ref 6–23)
CO2: 27 mEq/L (ref 19–32)
Calcium: 9.6 mg/dL (ref 8.4–10.5)
Chloride: 100 mEq/L (ref 96–112)
Creatinine, Ser: 0.93 mg/dL (ref 0.40–1.50)
GFR: 94.28 mL/min (ref >60.00)
Glucose, Bld: 99 mg/dL (ref 70–99)
Potassium: 4.5 mEq/L (ref 3.5–5.1)
Sodium: 137 mEq/L (ref 135–145)
Total Bilirubin: 0.8 mg/dL (ref 0.2–1.2)
Total Protein: 7.3 g/dL (ref 6.0–8.3)

## 2022-08-31 LAB — LIPID PANEL
Cholesterol: 201 mg/dL — ABNORMAL HIGH (ref 0–200)
HDL: 51.1 mg/dL (ref >39.00)
LDL Cholesterol: 117 mg/dL — ABNORMAL HIGH (ref 0–99)
NonHDL: 149.51
Total CHOL/HDL Ratio: 4
Triglycerides: 162 mg/dL — ABNORMAL HIGH (ref 0.0–149.0)
VLDL: 32.4 mg/dL (ref 0.0–40.0)

## 2022-08-31 LAB — TSH: TSH: 1.65 u[IU]/mL (ref 0.35–5.50)

## 2022-08-31 LAB — PSA: PSA: 0.39 ng/mL (ref 0.10–4.00)

## 2022-08-31 LAB — HEMOGLOBIN A1C: Hgb A1c MFr Bld: 6 % (ref 4.6–6.5)

## 2022-08-31 MED ORDER — ATORVASTATIN CALCIUM 40 MG PO TABS: ORAL_TABLET | ORAL | 2 refills | Status: DC

## 2022-08-31 MED ORDER — LISINOPRIL 20 MG PO TABS: 20.0000 mg | ORAL_TABLET | Freq: Every day | ORAL | 2 refills | Status: DC

## 2022-08-31 NOTE — Patient Instructions (Addendum)
A few things to remember from today's visit:  Routine general medical examination at a health care facility  Essential hypertension, benign - Plan: TSH  Prostate cancer screening - Plan: PSA(Must document that pt has been informed of limitations of PSA testing.)  Mixed hyperlipidemia - Plan: Lipid panel  Loud snoring - Plan: Ambulatory referral to Sleep Studies  Hyperpigmented skin lesion - Plan: Ambulatory referral to Dermatology  Colon cancer screening - Plan: Cologuard  Screening for endocrine, metabolic and immunity disorder - Plan: Comprehensive metabolic panel, Hemoglobin A1c  If you need refills for medications you take chronically, please call your pharmacy. Do not use My Chart to request refills or for acute issues that need immediate attention. If you send a my chart message, it may take a few days to be addressed, specially if I am not in the office.  Please be sure medication list is accurate. If a new problem present, please set up appointment sooner than planned today.  Health Maintenance, Male Adopting a healthy lifestyle and getting preventive care are important in promoting health and wellness. Ask your health care provider about: The right schedule for you to have regular tests and exams. Things you can do on your own to prevent diseases and keep yourself healthy. What should I know about diet, weight, and exercise? Eat a healthy diet  Eat a diet that includes plenty of vegetables, fruits, low-fat dairy products, and lean protein. Do not eat a lot of foods that are high in solid fats, added sugars, or sodium. Maintain a healthy weight Body mass index (BMI) is a measurement that can be used to identify possible weight problems. It estimates body fat based on height and weight. Your health care provider can help determine your BMI and help you achieve or maintain a healthy weight. Get regular exercise Get regular exercise. This is one of the most important things  you can do for your health. Most adults should: Exercise for at least 150 minutes each week. The exercise should increase your heart rate and make you sweat (moderate-intensity exercise). Do strengthening exercises at least twice a week. This is in addition to the moderate-intensity exercise. Spend less time sitting. Even light physical activity can be beneficial. Watch cholesterol and blood lipids Have your blood tested for lipids and cholesterol at 52 years of age, then have this test every 5 years. You may need to have your cholesterol levels checked more often if: Your lipid or cholesterol levels are high. You are older than 52 years of age. You are at high risk for heart disease. What should I know about cancer screening? Many types of cancers can be detected early and may often be prevented. Depending on your health history and family history, you may need to have cancer screening at various ages. This may include screening for: Colorectal cancer. Prostate cancer. Skin cancer. Lung cancer. What should I know about heart disease, diabetes, and high blood pressure? Blood pressure and heart disease High blood pressure causes heart disease and increases the risk of stroke. This is more likely to develop in people who have high blood pressure readings or are overweight. Talk with your health care provider about your target blood pressure readings. Have your blood pressure checked: Every 3-5 years if you are 14-75 years of age. Every year if you are 62 years old or older. If you are between the ages of 30 and 72 and are a current or former smoker, ask your health care provider if you  should have a one-time screening for abdominal aortic aneurysm (AAA). Diabetes Have regular diabetes screenings. This checks your fasting blood sugar level. Have the screening done: Once every three years after age 86 if you are at a normal weight and have a low risk for diabetes. More often and at a younger  age if you are overweight or have a high risk for diabetes. What should I know about preventing infection? Hepatitis B If you have a higher risk for hepatitis B, you should be screened for this virus. Talk with your health care provider to find out if you are at risk for hepatitis B infection. Hepatitis C Blood testing is recommended for: Everyone born from [birth year] through 1966. Anyone with known risk factors for hepatitis C. Sexually transmitted infections (STIs) You should be screened each year for STIs, including gonorrhea and chlamydia, if: You are sexually active and are younger than 52 years of age. You are older than 52 years of age and your health care provider tells you that you are at risk for this type of infection. Your sexual activity has changed since you were last screened, and you are at increased risk for chlamydia or gonorrhea. Ask your health care provider if you are at risk. Ask your health care provider about whether you are at high risk for HIV. Your health care provider may recommend a prescription medicine to help prevent HIV infection. If you choose to take medicine to prevent HIV, you should first get tested for HIV. You should then be tested every 3 months for as long as you are taking the medicine. Follow these instructions at home: Alcohol use Do not drink alcohol if your health care provider tells you not to drink. If you drink alcohol: Limit how much you have to 0-2 drinks a day. Know how much alcohol is in your drink. In the [place] one drink equals one 12 oz bottle of beer (355 mL), one 5 oz glass of wine (148 mL), or one 1 oz glass of hard liquor (44 mL). Lifestyle Do not use any products that contain nicotine or tobacco. These products include cigarettes, chewing tobacco, and vaping devices, such as e-cigarettes. If you need help quitting, ask your health care provider. Do not use street drugs. Do not share needles. Ask your health care provider for help if you  need support or information about quitting drugs. General instructions Schedule regular health, dental, and eye exams. Stay current with your vaccines. Tell your health care provider if: You often feel depressed. You have ever been abused or do not feel safe at home. Summary Adopting a healthy lifestyle and getting preventive care are important in promoting health and wellness. Follow your health care provider's instructions about healthy diet, exercising, and getting tested or screened for diseases. Follow your health care provider's instructions on monitoring your cholesterol and blood pressure. This information is not intended to replace advice given to you by your health care provider. Make sure you discuss any questions you have with your health care provider. Document Revised: 03/29/2021 Document Reviewed: 03/29/2021 Elsevier Patient Education  [employer].

## 2022-08-31 NOTE — Assessment & Plan Note (Signed)
Today here in the office BP is adequately controlled. He is reporting some SBP mildly elevated at home, 140. For now no changes in lisinopril dose but if SBP is frequently=>140, we will need to adjust treatment. Continue low-salt diet.

## 2022-08-31 NOTE — Assessment & Plan Note (Signed)
Continue atorvastatin 40 mg daily and low-fat diet. Further recommendation will be given according to lipid panel result. 

## 2022-09-13 DIAGNOSIS — L821 Other seborrheic keratosis: Secondary | ICD-10-CM | POA: Diagnosis not present

## 2022-09-13 DIAGNOSIS — L918 Other hypertrophic disorders of the skin: Secondary | ICD-10-CM | POA: Diagnosis not present

## 2022-09-13 DIAGNOSIS — L814 Other melanin hyperpigmentation: Secondary | ICD-10-CM | POA: Diagnosis not present

## 2022-09-13 DIAGNOSIS — L738 Other specified follicular disorders: Secondary | ICD-10-CM | POA: Diagnosis not present

## 2022-10-02 DIAGNOSIS — Z1211 Encounter for screening for malignant neoplasm of colon: Secondary | ICD-10-CM | POA: Diagnosis not present

## 2022-10-03 ENCOUNTER — Encounter: Payer: Self-pay | Admitting: Family Medicine

## 2022-10-09 LAB — COLOGUARD: COLOGUARD: NEGATIVE

## 2022-12-04 ENCOUNTER — Encounter: Payer: Self-pay | Admitting: Family Medicine

## 2022-12-04 DIAGNOSIS — E782 Mixed hyperlipidemia: Secondary | ICD-10-CM

## 2022-12-05 MED ORDER — ATORVASTATIN CALCIUM 40 MG PO TABS: ORAL_TABLET | ORAL | 2 refills | Status: DC

## 2022-12-05 MED ORDER — LISINOPRIL 20 MG PO TABS: 20.0000 mg | ORAL_TABLET | Freq: Every day | ORAL | 2 refills | Status: DC

## 2023-02-20 ENCOUNTER — Encounter: Payer: Self-pay | Admitting: Family Medicine

## 2023-05-05 ENCOUNTER — Encounter: Payer: Self-pay | Admitting: Family Medicine

## 2023-05-12 ENCOUNTER — Other Ambulatory Visit: Payer: Self-pay | Admitting: Family Medicine

## 2023-05-12 DIAGNOSIS — N529 Male erectile dysfunction, unspecified: Secondary | ICD-10-CM

## 2023-07-11 DIAGNOSIS — D2239 Melanocytic nevi of other parts of face: Secondary | ICD-10-CM | POA: Diagnosis not present

## 2023-07-11 DIAGNOSIS — L738 Other specified follicular disorders: Secondary | ICD-10-CM | POA: Diagnosis not present

## 2023-07-11 DIAGNOSIS — L720 Epidermal cyst: Secondary | ICD-10-CM | POA: Diagnosis not present

## 2023-09-18 ENCOUNTER — Encounter: Payer: Self-pay | Admitting: Family Medicine

## 2023-09-18 ENCOUNTER — Ambulatory Visit (INDEPENDENT_AMBULATORY_CARE_PROVIDER_SITE_OTHER): Payer: BC Managed Care – PPO | Admitting: Family Medicine

## 2023-09-18 VITALS — BP 120/70 | HR 100 | Temp 97.9°F | Resp 12 | Ht 68.0 in | Wt 212.1 lb

## 2023-09-18 DIAGNOSIS — Z130 Encounter for screening for diseases of the blood and blood-forming organs and certain disorders involving the immune mechanism: Secondary | ICD-10-CM

## 2023-09-18 DIAGNOSIS — Z0000 Encounter for general adult medical examination without abnormal findings: Secondary | ICD-10-CM | POA: Diagnosis not present

## 2023-09-18 DIAGNOSIS — Z1329 Encounter for screening for other suspected endocrine disorder: Secondary | ICD-10-CM | POA: Diagnosis not present

## 2023-09-18 DIAGNOSIS — Z23 Encounter for immunization: Secondary | ICD-10-CM | POA: Diagnosis not present

## 2023-09-18 DIAGNOSIS — I10 Essential (primary) hypertension: Secondary | ICD-10-CM | POA: Diagnosis not present

## 2023-09-18 DIAGNOSIS — N529 Male erectile dysfunction, unspecified: Secondary | ICD-10-CM

## 2023-09-18 DIAGNOSIS — E782 Mixed hyperlipidemia: Secondary | ICD-10-CM | POA: Diagnosis not present

## 2023-09-18 DIAGNOSIS — Z13228 Encounter for screening for other metabolic disorders: Secondary | ICD-10-CM

## 2023-09-18 HISTORY — DX: Encounter for general adult medical examination without abnormal findings: Z00.00

## 2023-09-18 LAB — LIPID PANEL
Cholesterol: 219 mg/dL — ABNORMAL HIGH (ref 0–200)
HDL: 64.8 mg/dL (ref >39.00)
LDL Cholesterol: 140 mg/dL — ABNORMAL HIGH (ref 0–99)
NonHDL: 154.36
Total CHOL/HDL Ratio: 3
Triglycerides: 74 mg/dL (ref 0.0–149.0)
VLDL: 14.8 mg/dL (ref 0.0–40.0)

## 2023-09-18 LAB — COMPREHENSIVE METABOLIC PANEL
ALT: 25 U/L (ref 0–53)
AST: 24 U/L (ref 0–37)
Albumin: 4.8 g/dL (ref 3.5–5.2)
Alkaline Phosphatase: 50 U/L (ref 39–117)
BUN: 24 mg/dL — ABNORMAL HIGH (ref 6–23)
CO2: 29 meq/L (ref 19–32)
Calcium: 9.9 mg/dL (ref 8.4–10.5)
Chloride: 101 meq/L (ref 96–112)
Creatinine, Ser: 0.94 mg/dL (ref 0.40–1.50)
GFR: 92.4 mL/min (ref >60.00)
Glucose, Bld: 108 mg/dL — ABNORMAL HIGH (ref 70–99)
Potassium: 3.9 meq/L (ref 3.5–5.1)
Sodium: 139 meq/L (ref 135–145)
Total Bilirubin: 1.2 mg/dL (ref 0.2–1.2)
Total Protein: 7.4 g/dL (ref 6.0–8.3)

## 2023-09-18 LAB — HEMOGLOBIN A1C: Hgb A1c MFr Bld: 5.8 % (ref 4.6–6.5)

## 2023-09-18 MED ORDER — LISINOPRIL 20 MG PO TABS: 20.0000 mg | ORAL_TABLET | Freq: Every day | ORAL | 3 refills | Status: DC

## 2023-09-18 MED ORDER — SILDENAFIL CITRATE 20 MG PO TABS: 40.0000 mg | ORAL_TABLET | Freq: Every day | ORAL | 0 refills | Status: DC | PRN

## 2023-09-18 NOTE — Assessment & Plan Note (Signed)
BP adequately controlled, home BP's 120-130/70's. Continue Lisinopril 20 mg daily and low fat diet. Eye exam is current. As far as BP's are stable at home, annual follow up can be continued.

## 2023-09-18 NOTE — Assessment & Plan Note (Signed)
We discussed the importance of regular physical activity and healthy diet for prevention of chronic illness and/or complications. Preventive guidelines reviewed. Vaccination up dated. Cologuard in 09/2022 negative. Prefers to hold on prostate cancer screening this time, it has been normal. Next CPE in a year.

## 2023-09-18 NOTE — Patient Instructions (Addendum)
A few things to remember from today's visit:  Routine general medical examination at a health care facility  Essential hypertension, benign - Plan: Comprehensive metabolic panel  Mixed hyperlipidemia - Plan: Comprehensive metabolic panel, Lipid panel  Colon cancer screening  Screening for endocrine, metabolic and immunity disorder - Plan: Hemoglobin A1c  Erectile dysfunction, unspecified erectile dysfunction type - Plan: sildenafil (REVATIO) 20 MG tablet  If you need refills for medications you take chronically, please call your pharmacy. Do not use My Chart to request refills or for acute issues that need immediate attention. If you send a my chart message, it may take a few days to be addressed, specially if I am not in the office.  Please be sure medication list is accurate. If a new problem present, please set up appointment sooner than planned today.  Health Maintenance, Male Adopting a healthy lifestyle and getting preventive care are important in promoting health and wellness. Ask your health care provider about: The right schedule for you to have regular tests and exams. Things you can do on your own to prevent diseases and keep yourself healthy. What should I know about diet, weight, and exercise? Eat a healthy diet  Eat a diet that includes plenty of vegetables, fruits, low-fat dairy products, and lean protein. Do not eat a lot of foods that are high in solid fats, added sugars, or sodium. Maintain a healthy weight Body mass index (BMI) is a measurement that can be used to identify possible weight problems. It estimates body fat based on height and weight. Your health care provider can help determine your BMI and help you achieve or maintain a healthy weight. Get regular exercise Get regular exercise. This is one of the most important things you can do for your health. Most adults should: Exercise for at least 150 minutes each week. The exercise should increase your heart  rate and make you sweat (moderate-intensity exercise). Do strengthening exercises at least twice a week. This is in addition to the moderate-intensity exercise. Spend less time sitting. Even light physical activity can be beneficial. Watch cholesterol and blood lipids Have your blood tested for lipids and cholesterol at 53 years of age, then have this test every 5 years. You may need to have your cholesterol levels checked more often if: Your lipid or cholesterol levels are high. You are older than 53 years of age. You are at high risk for heart disease. What should I know about cancer screening? Many types of cancers can be detected early and may often be prevented. Depending on your health history and family history, you may need to have cancer screening at various ages. This may include screening for: Colorectal cancer. Prostate cancer. Skin cancer. Lung cancer. What should I know about heart disease, diabetes, and high blood pressure? Blood pressure and heart disease High blood pressure causes heart disease and increases the risk of stroke. This is more likely to develop in people who have high blood pressure readings or are overweight. Talk with your health care provider about your target blood pressure readings. Have your blood pressure checked: Every 3-5 years if you are 39-67 years of age. Every year if you are 85 years old or older. If you are between the ages of 5 and 30 and are a current or former smoker, ask your health care provider if you should have a one-time screening for abdominal aortic aneurysm (AAA). Diabetes Have regular diabetes screenings. This checks your fasting blood sugar level. Have the screening  done: Once every three years after age 52 if you are at a normal weight and have a low risk for diabetes. More often and at a younger age if you are overweight or have a high risk for diabetes. What should I know about preventing infection? Hepatitis B If you have a  higher risk for hepatitis B, you should be screened for this virus. Talk with your health care provider to find out if you are at risk for hepatitis B infection. Hepatitis C Blood testing is recommended for: Everyone born from [birth year] through 1966. Anyone with known risk factors for hepatitis C. Sexually transmitted infections (STIs) You should be screened each year for STIs, including gonorrhea and chlamydia, if: You are sexually active and are younger than 53 years of age. You are older than 53 years of age and your health care provider tells you that you are at risk for this type of infection. Your sexual activity has changed since you were last screened, and you are at increased risk for chlamydia or gonorrhea. Ask your health care provider if you are at risk. Ask your health care provider about whether you are at high risk for HIV. Your health care provider may recommend a prescription medicine to help prevent HIV infection. If you choose to take medicine to prevent HIV, you should first get tested for HIV. You should then be tested every 3 months for as long as you are taking the medicine. Follow these instructions at home: Alcohol use Do not drink alcohol if your health care provider tells you not to drink. If you drink alcohol: Limit how much you have to 0-2 drinks a day. Know how much alcohol is in your drink. In the [place] one drink equals one 12 oz bottle of beer (355 mL), one 5 oz glass of wine (148 mL), or one 1 oz glass of hard liquor (44 mL). Lifestyle Do not use any products that contain nicotine or tobacco. These products include cigarettes, chewing tobacco, and vaping devices, such as e-cigarettes. If you need help quitting, ask your health care provider. Do not use street drugs. Do not share needles. Ask your health care provider for help if you need support or information about quitting drugs. General instructions Schedule regular health, dental, and eye exams. Stay current  with your vaccines. Tell your health care provider if: You often feel depressed. You have ever been abused or do not feel safe at home. Summary Adopting a healthy lifestyle and getting preventive care are important in promoting health and wellness. Follow your health care provider's instructions about healthy diet, exercising, and getting tested or screened for diseases. Follow your health care provider's instructions on monitoring your cholesterol and blood pressure. This information is not intended to replace advice given to you by your health care provider. Make sure you discuss any questions you have with your health care provider. Document Revised: 03/29/2021 Document Reviewed: 03/29/2021 Elsevier Patient Education  2024 [employer].

## 2023-09-18 NOTE — Assessment & Plan Note (Signed)
Continue atorvastatin 40 mg daily and low-fat diet. Further recommendation will be given according to lipid panel result. 

## 2023-09-18 NOTE — Progress Notes (Signed)
HPI: [name] is a 52 y.o.male with a PMHx significant for HLD, HTN, and ED who is here today for his routine physical examination.  Last CPE: 08/31/2022 He reports no new problems since his last visit.   Exercise: He states he does a cycling class 3x per week, a strength class 2x per week, and walks once on weekends. He denies SOB or chest pain while exercising.  Diet: He says he tries to eat healthy, with daily vegetables. He says he occasionally has potato chips at night.  Sleep: He reports he is sleeping 7-8 hours per night.  Alcohol Use: He drinks 2-3 times per week, usually gin or vodka.  Smoking: He hasn't smoked for about 10 years. Vision: UTD on routine vision care.  Dental: UTD on routine dental care.   Immunization History  Administered Date(s) Administered   Influenza Whole 10/05/2010   Tdap 10/10/2013   Health Maintenance  Topic Date Due   Colonoscopy  Never done   Zoster Vaccines- Shingrix (1 of 2) Never done   INFLUENZA VACCINE  06/22/2023   COVID-19 Vaccine (1 - 2023-24 season) Never done   HIV Screening  04/26/2026 (Originally 12/20/1985)   DTaP/Tdap/Td (2 - Td or Tdap) 10/11/2023   Hepatitis C Screening  Completed   HPV VACCINES  Aged Out   Last prostate ca screening: He says he gets up 1-2 times at night to urinate, stable for a while.  Lab Results  Component Value Date   PSA 0.39 08/31/2022   PSA 0.41 04/26/2021   PSA 0.51 07/25/2017   Chronic medical problems:   Hypertension:  Medications: Currently on lisinopril 20 mg daily.  BP readings at home: He says he checks at home periodically. His normal readings are 120s/70s-80s.  Side effects: none No known hx of OSA. He says his wife occasionally tells him he snores. Negative for unusual or severe headache, visual changes, exertional chest pain, dyspnea,  focal weakness, or edema.  Lab Results  Component Value Date   CREATININE 0.93 08/31/2022   BUN 29 (H) 08/31/2022   NA 137 08/31/2022    K 4.5 08/31/2022   CL 100 08/31/2022   CO2 27 08/31/2022   Hyperlipidemia: Currently on atorvastatin 40 mg daily.  Side effects from medication: none Lab Results  Component Value Date   CHOL 201 (H) 08/31/2022   HDL 51.10 08/31/2022   LDLCALC 117 (H) 08/31/2022   LDLDIRECT 120.0 04/26/2021   TRIG 162.0 (H) 08/31/2022   CHOLHDL 4 08/31/2022   HgA1C has been mildly elevated, last one in 08/2022 was 6.0. He currently takes Revatio 20 mg for ED. He has taken medication for years and no side effects.  Concerns today: none  Review of Systems  Constitutional:  Negative for activity change, appetite change, fatigue and fever.  HENT:  Negative for nosebleeds, sore throat and trouble swallowing.   Eyes:  Negative for redness and visual disturbance.  Respiratory:  Negative for cough, shortness of breath and wheezing.   Cardiovascular:  Negative for chest pain, palpitations and leg swelling.  Gastrointestinal:  Negative for abdominal pain, blood in stool, nausea and vomiting.  Endocrine: Negative for cold intolerance, heat intolerance, polydipsia, polyphagia and polyuria.  Genitourinary:  Negative for decreased urine volume, dysuria, genital sores, hematuria and testicular pain.  Musculoskeletal:  Negative for arthralgias, back pain, joint swelling and myalgias.  Skin:  Negative for color change and rash.  Allergic/Immunologic: Negative for environmental allergies.  Neurological:  Negative for syncope, weakness  and headaches.  Hematological:  Negative for adenopathy. Does not bruise/bleed easily.  Psychiatric/Behavioral:  Negative for confusion and hallucinations.   All other systems reviewed and are negative.  Current Outpatient Medications on File Prior to Visit  Medication Sig Dispense Refill   atorvastatin (LIPITOR) 40 MG tablet TAKE 1 TABLET(40 MG) BY MOUTH DAILY 90 tablet 2   lisinopril (ZESTRIL) 20 MG tablet Take 1 tablet (20 mg total) by mouth daily. 90 tablet 2   sildenafil  (REVATIO) 20 MG tablet take two and one-half tablets by mouth daily as needed as directed 90 tablet 0   No current facility-administered medications on file prior to visit.   Past Medical History:  Diagnosis Date   Hyperlipidemia    Hypertension    Past Surgical History:  Procedure Laterality Date   GALLBLADDER SURGERY     No Known Allergies  Family History  Problem Relation Age of Onset   Cancer Father        brain   Social History   Socioeconomic History   Marital status: Married    Spouse name: Not on file   Number of children: Not on file   Years of education: Not on file   Highest education level: Bachelor's degree (e.g., BA, AB, BS)  Occupational History   Not on file  Tobacco Use   Smoking status: Former    Types: Cigarettes, Cigars    Start date: 07/22/2014   Smokeless tobacco: Never   Tobacco comments:    1 cigarette a month  Substance and Sexual Activity   Alcohol use: Yes    Alcohol/week: 0.0 standard drinks of alcohol   Drug use: No   Sexual activity: Not on file  Other Topics Concern   Not on file  Social History Narrative   Not on file   Social Determinants of Health   Financial Resource Strain: Low Risk  (09/17/2023)   Overall Financial Resource Strain (CARDIA)    Difficulty of Paying Living Expenses: Not hard at all  Food Insecurity: No Food Insecurity (09/17/2023)   Hunger Vital Sign    Worried About Running Out of Food in the Last Year: Never true    Ran Out of Food in the Last Year: Never true  Transportation Needs: No Transportation Needs (09/17/2023)   PRAPARE - Administrator, Civil Service (Medical): No    Lack of Transportation (Non-Medical): No  Physical Activity: Sufficiently Active (09/17/2023)   Exercise Vital Sign    Days of Exercise per Week: 5 days    Minutes of Exercise per Session: 60 min  Stress: No Stress Concern Present (09/17/2023)   [employer] of Occupational Health - Occupational Stress  Questionnaire    Feeling of Stress : Not at all  Social Connections: Socially Integrated (09/17/2023)   Social Connection and Isolation Panel [NHANES]    Frequency of Communication with Friends and Family: More than three times a week    Frequency of Social Gatherings with Friends and Family: More than three times a week    Attends Religious Services: More than 4 times per year    Active Member of [employer] or Organizations: Yes    Attends Banker Meetings: More than 4 times per year    Marital Status: Married   Today's Vitals   09/18/23 0851  BP: 120/70  Pulse: 100  Resp: 12  Temp: 97.9 F (36.6 C)  TempSrc: Oral  SpO2: 98%  Weight: 212 lb 2 oz (96.2 kg)  Height: 5\' 8"  (1.727 m)   Body mass index is 32.25 kg/m.  Wt Readings from Last 3 Encounters:  08/31/22 216 lb 6 oz (98.1 kg)  02/28/22 222 lb 2 oz (100.8 kg)  04/26/21 214 lb 8 oz (97.3 kg)   Physical Exam Vitals and nursing note reviewed.  Constitutional:      General: He is not in acute distress.    Appearance: He is well-developed.  HENT:     Head: Normocephalic and atraumatic.     Right Ear: Tympanic membrane, ear canal and external ear normal.     Left Ear: Tympanic membrane, ear canal and external ear normal.     Mouth/Throat:     Mouth: Mucous membranes are moist.     Pharynx: Oropharynx is clear.  Eyes:     Extraocular Movements: Extraocular movements intact.     Conjunctiva/sclera: Conjunctivae normal.     Pupils: Pupils are equal, round, and reactive to light.  Neck:     Thyroid: No thyroid mass or thyromegaly.  Cardiovascular:     Rate and Rhythm: Normal rate and regular rhythm.     Pulses:          Dorsalis pedis pulses are 2+ on the right side and 2+ on the left side.     Heart sounds: No murmur heard. Pulmonary:     Effort: Pulmonary effort is normal. No respiratory distress.     Breath sounds: Normal breath sounds.  Abdominal:     Palpations: Abdomen is soft. There is no  hepatomegaly or mass.     Tenderness: There is no abdominal tenderness.  Genitourinary:    Comments: No concerns. Musculoskeletal:        General: No tenderness.     Cervical back: Normal range of motion.     Right lower leg: No edema.     Left lower leg: No edema.     Comments: No major deformities appreciated and no signs of synovitis.  Lymphadenopathy:     Cervical: No cervical adenopathy.     Upper Body:     Right upper body: No supraclavicular adenopathy.     Left upper body: No supraclavicular adenopathy.  Skin:    General: Skin is warm.     Findings: No erythema.  Neurological:     General: No focal deficit present.     Mental Status: He is alert and oriented to person, place, and time.     Cranial Nerves: No cranial nerve deficit.     Sensory: No sensory deficit.     Gait: Gait normal.     Deep Tendon Reflexes:     Reflex Scores:      Bicep reflexes are 2+ on the right side and 2+ on the left side.      Patellar reflexes are 2+ on the right side and 2+ on the left side. Psychiatric:        Mood and Affect: Mood and affect normal.   ASSESSMENT AND PLAN:  [name] was seen today for his routine annual physical examination.   Orders Placed This Encounter  Procedures   Tdap vaccine greater than or equal to 7yo IM   Comprehensive metabolic panel   Hemoglobin A1c   Lipid panel   Lab Results  Component Value Date   NA 139 09/18/2023   CL 101 09/18/2023   K 3.9 09/18/2023   CO2 29 09/18/2023   BUN 24 (H) 09/18/2023   CREATININE 0.94 09/18/2023   GFR  92.40 09/18/2023   CALCIUM 9.9 09/18/2023   ALBUMIN 4.8 09/18/2023   GLUCOSE 108 (H) 09/18/2023   Lab Results  Component Value Date   ALT 25 09/18/2023   AST 24 09/18/2023   ALKPHOS 50 09/18/2023   BILITOT 1.2 09/18/2023   Lab Results  Component Value Date   CHOL 219 (H) 09/18/2023   HDL 64.80 09/18/2023   LDLCALC 140 (H) 09/18/2023   LDLDIRECT 120.0 04/26/2021   TRIG 74.0 09/18/2023   CHOLHDL 3  09/18/2023   Lab Results  Component Value Date   HGBA1C 5.8 09/18/2023   Routine general medical examination at a health care facility Assessment & Plan: We discussed the importance of regular physical activity and healthy diet for prevention of chronic illness and/or complications. Preventive guidelines reviewed. Vaccination up dated. Cologuard in 09/2022 negative. Prefers to hold on prostate cancer screening this time, it has been normal. Next CPE in a year.   Essential hypertension, benign Assessment & Plan: BP adequately controlled, home BP's 120-130/70's. Continue Lisinopril 20 mg daily and low fat diet. Eye exam is current. As far as BP's are stable at home, annual follow up can be continued.  Orders: -     Comprehensive metabolic panel; Future -     Lisinopril; Take 1 tablet (20 mg total) by mouth daily.  Dispense: 90 tablet; Refill: 3  Mixed hyperlipidemia Assessment & Plan: Continue atorvastatin 40 mg daily and low-fat diet. Further recommendation will be given according to lipid panel result.  Orders: -     Comprehensive metabolic panel; Future -     Lipid panel; Future  Screening for endocrine, metabolic and immunity disorder -     Hemoglobin A1c; Future  Erectile dysfunction, unspecified erectile dysfunction type Assessment & Plan: Stable. Continue Sildenafil 20 mg 2 tabs daily prn.  Orders: -     Sildenafil Citrate; Take 2 tablets (40 mg total) by mouth daily as needed.  Dispense: 90 tablet; Refill: 0  Need for Tdap vaccination -     Tdap vaccine greater than or equal to 7yo IM  I, Rolla Etienne Wierda, acting as a Neurosurgeon for Mamta Rimmer [place], MD., have documented all relevant documentation on the behalf of Vallie Teters [place], MD, as directed by  Taryll Reichenberger [place], MD while in the presence of Arieal Cuoco [place], MD.   I, Suanne Marker, have reviewed all documentation for this visit. The documentation on 09/18/23 for the exam, diagnosis, procedures, and orders are all  accurate and complete.  Sharica Roedel G. [place], MD  [facility]. [place] office.

## 2023-09-18 NOTE — Assessment & Plan Note (Signed)
Stable. Continue Sildenafil 20 mg 2 tabs daily prn.

## 2023-10-16 ENCOUNTER — Encounter: Payer: Self-pay | Admitting: Family Medicine

## 2023-10-16 DIAGNOSIS — E782 Mixed hyperlipidemia: Secondary | ICD-10-CM

## 2023-10-16 MED ORDER — ATORVASTATIN CALCIUM 40 MG PO TABS: ORAL_TABLET | ORAL | 2 refills | Status: DC

## 2024-01-24 DIAGNOSIS — M25572 Pain in left ankle and joints of left foot: Secondary | ICD-10-CM | POA: Diagnosis not present

## 2024-02-14 DIAGNOSIS — S93402A Sprain of unspecified ligament of left ankle, initial encounter: Secondary | ICD-10-CM | POA: Diagnosis not present

## 2024-03-19 ENCOUNTER — Other Ambulatory Visit: Payer: Self-pay | Admitting: Family Medicine

## 2024-03-19 DIAGNOSIS — N529 Male erectile dysfunction, unspecified: Secondary | ICD-10-CM

## 2024-08-05 DIAGNOSIS — U071 COVID-19: Secondary | ICD-10-CM | POA: Diagnosis not present

## 2024-08-05 DIAGNOSIS — R1110 Vomiting, unspecified: Secondary | ICD-10-CM | POA: Diagnosis not present

## 2024-09-18 ENCOUNTER — Other Ambulatory Visit: Payer: Self-pay | Admitting: Family Medicine

## 2024-09-18 DIAGNOSIS — E782 Mixed hyperlipidemia: Secondary | ICD-10-CM

## 2024-09-19 NOTE — Telephone Encounter (Signed)
 Patient need to schedule CPE for more refills.

## 2024-09-25 ENCOUNTER — Encounter: Payer: Self-pay | Admitting: Family Medicine

## 2024-09-25 ENCOUNTER — Ambulatory Visit: Admitting: Family Medicine

## 2024-09-25 VITALS — BP 138/95 | HR 88 | Temp 97.9°F | Resp 16 | Ht 68.0 in | Wt 217.6 lb

## 2024-09-25 DIAGNOSIS — Z130 Encounter for screening for diseases of the blood and blood-forming organs and certain disorders involving the immune mechanism: Secondary | ICD-10-CM

## 2024-09-25 DIAGNOSIS — Z0000 Encounter for general adult medical examination without abnormal findings: Secondary | ICD-10-CM

## 2024-09-25 DIAGNOSIS — Z125 Encounter for screening for malignant neoplasm of prostate: Secondary | ICD-10-CM

## 2024-09-25 DIAGNOSIS — Z1329 Encounter for screening for other suspected endocrine disorder: Secondary | ICD-10-CM | POA: Diagnosis not present

## 2024-09-25 DIAGNOSIS — Z13228 Encounter for screening for other metabolic disorders: Secondary | ICD-10-CM | POA: Diagnosis not present

## 2024-09-25 DIAGNOSIS — E782 Mixed hyperlipidemia: Secondary | ICD-10-CM

## 2024-09-25 DIAGNOSIS — N529 Male erectile dysfunction, unspecified: Secondary | ICD-10-CM

## 2024-09-25 DIAGNOSIS — I10 Essential (primary) hypertension: Secondary | ICD-10-CM | POA: Diagnosis not present

## 2024-09-25 DIAGNOSIS — K625 Hemorrhage of anus and rectum: Secondary | ICD-10-CM

## 2024-09-25 DIAGNOSIS — L2989 Other pruritus: Secondary | ICD-10-CM

## 2024-09-25 DIAGNOSIS — Z0189 Encounter for other specified special examinations: Secondary | ICD-10-CM

## 2024-09-25 LAB — LIPID PANEL
Cholesterol: 205 mg/dL — ABNORMAL HIGH (ref 0–200)
HDL: 56.4 mg/dL (ref >39.00)
LDL Cholesterol: 126 mg/dL — ABNORMAL HIGH (ref 0–99)
NonHDL: 148.83
Total CHOL/HDL Ratio: 4
Triglycerides: 115 mg/dL (ref 0.0–149.0)
VLDL: 23 mg/dL (ref 0.0–40.0)

## 2024-09-25 LAB — COMPREHENSIVE METABOLIC PANEL WITH GFR
ALT: 24 U/L (ref 0–53)
AST: 25 U/L (ref 0–37)
Albumin: 4.8 g/dL (ref 3.5–5.2)
Alkaline Phosphatase: 49 U/L (ref 39–117)
BUN: 31 mg/dL — ABNORMAL HIGH (ref 6–23)
CO2: 28 meq/L (ref 19–32)
Calcium: 9.9 mg/dL (ref 8.4–10.5)
Chloride: 101 meq/L (ref 96–112)
Creatinine, Ser: 0.97 mg/dL (ref 0.40–1.50)
GFR: 88.34 mL/min (ref >60.00)
Glucose, Bld: 101 mg/dL — ABNORMAL HIGH (ref 70–99)
Potassium: 4.5 meq/L (ref 3.5–5.1)
Sodium: 138 meq/L (ref 135–145)
Total Bilirubin: 0.9 mg/dL (ref 0.2–1.2)
Total Protein: 7.4 g/dL (ref 6.0–8.3)

## 2024-09-25 LAB — HEMOGLOBIN A1C: Hgb A1c MFr Bld: 5.9 % (ref 4.6–6.5)

## 2024-09-25 NOTE — Assessment & Plan Note (Signed)
 Problem is stable. Continue sildenafil  20 mg 2 tablets daily as needed. He would like to have testosterone checked, added to labs today.

## 2024-09-25 NOTE — Assessment & Plan Note (Signed)
 We discussed the importance of regular physical activity and healthy diet for prevention of chronic illness and/or complications. Preventive guidelines reviewed. Vaccination: Prefers to hold on Prevnar 20 and influenza vaccine for now. Next CPE in a year.

## 2024-09-25 NOTE — Assessment & Plan Note (Signed)
Continue atorvastatin 40 mg daily and low-fat diet. Further recommendation will be given according to lipid panel result. 

## 2024-09-25 NOTE — Progress Notes (Unsigned)
 Chief Complaint  Patient presents with   Annual Exam   Follow-up    HTN,HLD   HPI: Charles Rowe is a 53 y.o.male with a PMHx significant for HLD, HTN, and ED who is here today for his routine physical examination and follow up.  Last CPE: 09/18/23 No ED/ER visits or major health issues since his last visit.  He still does a spinning classes  3x per week, a strength class 2x per week, and walks some weekends.  In general he follows a healthful diet daily vegetables.  Sleep: He reports he is sleeping 7-8 hours per night.  He drinks alcohol 3-4 times per week, usually gin or vodka.  He hasn't smoked for about 10-11 years. Vision: UTD on routine vision care.  Dental: UTD on routine dental care, 2 times per year..   Immunization History  Administered Date(s) Administered   Influenza Whole 10/05/2010   Tdap 10/10/2013, 09/18/2023   Health Maintenance  Topic Date Due   Hepatitis B Vaccines 19-59 Average Risk (1 of 3 - 19+ 3-dose series) Never done   COVID-19 Vaccine (1 - 2025-26 season) 10/11/2024 (Originally 07/22/2024)   Zoster Vaccines- Shingrix (1 of 2) 12/26/2024 (Originally 12/21/2019)   Influenza Vaccine  02/18/2025 (Originally 06/21/2024)   Pneumococcal Vaccine: 50+ Years (1 of 1 - PCV) 09/25/2025 (Originally 12/21/2019)   Colonoscopy  10/09/2025 (Originally 12/20/2014)   HIV Screening  04/26/2026 (Originally 12/20/1984)   DTaP/Tdap/Td (3 - Td or Tdap) 09/17/2033   Hepatitis C Screening  Completed   HPV VACCINES  Aged Out   Meningococcal B Vaccine  Aged Out   Last prostate ca screening: No changes in urinary frequency, nocturia x 1. Lab Results  Component Value Date   PSA 0.39 08/31/2022   PSA 0.41 04/26/2021   PSA 0.51 07/25/2017   Hypertension: Today BP mildly elevated, did not take his medication today. Currently on lisinopril  20 mg daily.  He checks at home periodically, 120s/70s-80s.  Negative for unusual or severe headache, visual changes, exertional chest  pain, dyspnea,  focal weakness, or edema.  Lab Results  Component Value Date   CREATININE 0.94 09/18/2023   BUN 24 (H) 09/18/2023   NA 139 09/18/2023   K 3.9 09/18/2023   CL 101 09/18/2023   CO2 29 09/18/2023   Hyperlipidemia:Currently on atorvastatin  40 mg daily.   Lab Results  Component Value Date   CHOL 219 (H) 09/18/2023   HDL 64.80 09/18/2023   LDLCALC 140 (H) 09/18/2023   LDLDIRECT 120.0 04/26/2021   TRIG 74.0 09/18/2023   CHOLHDL 3 09/18/2023   HgA1C has been mildly elevated, no hx of diabetes. Lab Results  Component Value Date   HGBA1C 5.8 09/18/2023   He currently takes Sildenafil  20 mg 2 tabs daily prn for ED. He has taken medication for years and no side effects. He is requesting testosterone check, states that some of his friends are on testosterone for wt loss. Denies fatigue or depressed mood.  He mentions episode of rectal bleeding about a month ago. He attributes episode to hemorrhoids , no known hx. No associated abdominal pain or changes in bowel habits.  Review of Systems  Constitutional:  Negative for activity change, appetite change and fever.  HENT:  Negative for nosebleeds, sore throat and trouble swallowing.   Eyes:  Negative for redness and visual disturbance.  Respiratory:  Negative for cough, shortness of breath and wheezing.   Cardiovascular:  Negative for chest pain, palpitations and leg swelling.  Gastrointestinal:  Negative for abdominal pain, blood in stool, nausea and vomiting.  Endocrine: Negative for cold intolerance, heat intolerance, polydipsia, polyphagia and polyuria.  Genitourinary:  Negative for decreased urine volume, dysuria, genital sores, hematuria and testicular pain.  Musculoskeletal:  Negative for gait problem and myalgias.  Skin:  Positive for rash (noted today, has had it intermittent for 6 months.). Negative for color change.  Allergic/Immunologic: Negative for environmental allergies.  Neurological:  Negative for  syncope, weakness and headaches.  Hematological:  Negative for adenopathy. Does not bruise/bleed easily.  Psychiatric/Behavioral:  Negative for confusion and hallucinations.   All other systems reviewed and are negative.  Current Outpatient Medications on File Prior to Visit  Medication Sig Dispense Refill   atorvastatin  (LIPITOR) 40 MG tablet TAKE 1 TABLET(40 MG) BY MOUTH DAILY 90 tablet 0   lisinopril  (ZESTRIL ) 20 MG tablet Take 1 tablet (20 mg total) by mouth daily. 90 tablet 3   sildenafil  (REVATIO ) 20 MG tablet TAKE TWO TABLETS BY MOUTH DAILY AS NEEDED 90 tablet 2   No current facility-administered medications on file prior to visit.   Past Medical History:  Diagnosis Date   Allergy 1977   Pollen   Hyperlipidemia    Hypertension    Routine general medical examination at a health care facility 09/18/2023   Past Surgical History:  Procedure Laterality Date   CHOLECYSTECTOMY  2004   GALLBLADDER SURGERY     No Known Allergies  Family History  Problem Relation Age of Onset   Cancer Father        brain   Heart disease Father    Social History   Socioeconomic History   Marital status: Married    Spouse name: Not on file   Number of children: Not on file   Years of education: Not on file   Highest education level: Bachelor's degree (e.g., BA, AB, BS)  Occupational History   Not on file  Tobacco Use   Smoking status: Former    Types: Cigarettes, Cigars    Start date: 07/22/2014   Smokeless tobacco: Never   Tobacco comments:    1 cigarette a month  Substance and Sexual Activity   Alcohol use: Yes    Alcohol/week: 10.0 standard drinks of alcohol    Types: 10 Shots of liquor per week   Drug use: No   Sexual activity: Yes    Birth control/protection: Implant  Other Topics Concern   Not on file  Social History Narrative   Not on file   Social Drivers of Health   Financial Resource Strain: Low Risk  (09/23/2024)   Overall Financial Resource Strain (CARDIA)     Difficulty of Paying Living Expenses: Not hard at all  Food Insecurity: No Food Insecurity (09/23/2024)   Hunger Vital Sign    Worried About Running Out of Food in the Last Year: Never true    Ran Out of Food in the Last Year: Never true  Transportation Needs: No Transportation Needs (09/23/2024)   PRAPARE - Administrator, Civil Service (Medical): No    Lack of Transportation (Non-Medical): No  Physical Activity: Sufficiently Active (09/23/2024)   Exercise Vital Sign    Days of Exercise per Week: 5 days    Minutes of Exercise per Session: 50 min  Stress: No Stress Concern Present (09/23/2024)   [employer] of Occupational Health - Occupational Stress Questionnaire    Feeling of Stress: Not at all  Social Connections: Socially Integrated (09/23/2024)   Social Connection  and Isolation Panel    Frequency of Communication with Friends and Family: More than three times a week    Frequency of Social Gatherings with Friends and Family: More than three times a week    Attends Religious Services: More than 4 times per year    Active Member of Clubs or Organizations: Yes    Attends Banker Meetings: More than 4 times per year    Marital Status: Married   Today's Vitals   09/25/24 0900 09/25/24 0921  BP: (!) 160/100 (!) 138/95  Pulse: 88   Resp: 16   Temp: 97.9 F (36.6 C)   SpO2: 97%   Weight: 217 lb 9.6 oz (98.7 kg)   Height: 5' 8 (1.727 m)    Body mass index is 33.09 kg/m.  Wt Readings from Last 3 Encounters:  09/25/24 217 lb 9.6 oz (98.7 kg)  09/18/23 212 lb 2 oz (96.2 kg)  08/31/22 216 lb 6 oz (98.1 kg)   Physical Exam Vitals and nursing note reviewed.  Constitutional:      General: He is not in acute distress.    Appearance: He is well-developed.  HENT:     Head: Normocephalic and atraumatic.     Right Ear: Tympanic membrane, ear canal and external ear normal.     Left Ear: Tympanic membrane, ear canal and external ear normal.      Mouth/Throat:     Mouth: Mucous membranes are moist.     Pharynx: Oropharynx is clear.  Eyes:     Extraocular Movements: Extraocular movements intact.     Conjunctiva/sclera: Conjunctivae normal.     Pupils: Pupils are equal, round, and reactive to light.  Neck:     Thyroid : No thyroid  mass or thyromegaly.  Cardiovascular:     Rate and Rhythm: Normal rate and regular rhythm.     Pulses:          Dorsalis pedis pulses are 2+ on the right side and 2+ on the left side.     Heart sounds: No murmur heard. Pulmonary:     Effort: Pulmonary effort is normal. No respiratory distress.     Breath sounds: Normal breath sounds.  Abdominal:     Palpations: Abdomen is soft. There is no hepatomegaly or mass.     Tenderness: There is no abdominal tenderness.  Genitourinary:    Comments: No concerns. Musculoskeletal:        General: No tenderness.     Cervical back: Normal range of motion.     Right lower leg: No edema.     Left lower leg: No edema.     Comments: No major deformities appreciated and no signs of synovitis.  Lymphadenopathy:     Cervical: No cervical adenopathy.     Upper Body:     Right upper body: No supraclavicular adenopathy.     Left upper body: No supraclavicular adenopathy.  Skin:    General: Skin is warm.     Findings: Rash (On anterior aspect of elbows and dorsal aspect of wrist.) present. No erythema. Rash is macular and scaling.  Neurological:     General: No focal deficit present.     Mental Status: He is alert and oriented to person, place, and time.     Cranial Nerves: No cranial nerve deficit.     Sensory: No sensory deficit.     Gait: Gait normal.     Deep Tendon Reflexes:     Reflex Scores:  Bicep reflexes are 2+ on the right side and 2+ on the left side.      Patellar reflexes are 2+ on the right side and 2+ on the left side. Psychiatric:        Mood and Affect: Mood and affect normal.   ASSESSMENT AND PLAN:  Charles Rowe was seen today for his  routine annual physical examination and follow up.   Orders Placed This Encounter  Procedures   Comprehensive metabolic panel with GFR   Hemoglobin A1c   Lipid panel   TSH   Testosterone   PSA   Ambulatory referral to Gastroenterology   Routine general medical examination at a health care facility Assessment & Plan: We discussed the importance of regular physical activity and healthy diet for prevention of chronic illness and/or complications. Preventive guidelines reviewed. Vaccination: Prefers to hold on Prevnar 20 and influenza vaccine for now. Next CPE in a year.  Mixed hyperlipidemia Assessment & Plan: Continue atorvastatin  40 mg daily and low-fat diet. Further recommendation will be given according to lipid panel result.  Orders: -     Comprehensive metabolic panel with GFR; Future -     Lipid panel; Future  Essential hypertension, benign Assessment & Plan: BP elevated today, he did not take his antihypertensive medication today. Improved after a few minutes. Instructed to monitor BP daily at home and to let me know about readings in about 2 weeks. Continue lisinopril  20 mg daily and low-salt diet. Currently asymptomatic, I would like to have an EKG done but unfortunately our EKG machine is not at the building (being repaired) at this time and not sure when he will be brought back.  We could arrange a nurse visit for an EKG once the machine is available. Eye exam is current. Clearly instructed about warning signs. As far as BP is at goal, we can continue annual follow-ups.  Orders: -     Comprehensive metabolic panel with GFR; Future -     TSH; Future  Screening for endocrine, metabolic and immunity disorder -     Comprehensive metabolic panel with GFR; Future -     Hemoglobin A1c; Future  Erectile dysfunction, unspecified erectile dysfunction type Assessment & Plan: Problem is stable. Continue sildenafil  20 mg 2 tablets daily as needed. He would like to have  testosterone checked, added to labs today.  Orders: -     TSH; Future -     Testosterone; Future  Patient request for diagnostic testing -     Testosterone; Future  Prostate cancer screening -     PSA; Future  Rectal bleed -     Ambulatory referral to Gastroenterology  Pruritic erythematous rash ? Eczema. Recommend OTC Cortizone, small amount 2 times per day prn.Some side effects discussed. F/U as needed.  Return in 1 year (on 09/25/2025) for CPE, chronic problems.  Sharonlee Nine G. Shanard Treto, MD  [facility]. [place] office.

## 2024-09-25 NOTE — Patient Instructions (Addendum)
 A few things to remember from today's visit:  Routine general medical examination at a health care facility  Mixed hyperlipidemia - Plan: Comprehensive metabolic panel with GFR, Lipid panel  Essential hypertension, benign - Plan: Comprehensive metabolic panel with GFR, TSH  Screening for endocrine, metabolic and immunity disorder - Plan: Comprehensive metabolic panel with GFR, Hemoglobin A1c  Erectile dysfunction, unspecified erectile dysfunction type - Plan: TSH, Testosterone  Patient request for diagnostic testing - Plan: Testosterone  Prostate cancer screening - Plan: PSA  Let me know about blood pressure readings in 2 weeks, goal is 120's/70's or lower.  If you need refills for medications you take chronically, please call your pharmacy. Do not use My Chart to request refills or for acute issues that need immediate attention. If you send a my chart message, it may take a few days to be addressed, specially if I am not in the office.  Please be sure medication list is accurate. If a new problem present, please set up appointment sooner than planned today.  Health Maintenance, Male Adopting a healthy lifestyle and getting preventive care are important in promoting health and wellness. Ask your health care provider about: The right schedule for you to have regular tests and exams. Things you can do on your own to prevent diseases and keep yourself healthy. What should I know about diet, weight, and exercise? Eat a healthy diet  Eat a diet that includes plenty of vegetables, fruits, low-fat dairy products, and lean protein. Do not eat a lot of foods that are high in solid fats, added sugars, or sodium. Maintain a healthy weight Body mass index (BMI) is a measurement that can be used to identify possible weight problems. It estimates body fat based on height and weight. Your health care provider can help determine your BMI and help you achieve or maintain a healthy weight. Get  regular exercise Get regular exercise. This is one of the most important things you can do for your health. Most adults should: Exercise for at least 150 minutes each week. The exercise should increase your heart rate and make you sweat (moderate-intensity exercise). Do strengthening exercises at least twice a week. This is in addition to the moderate-intensity exercise. Spend less time sitting. Even light physical activity can be beneficial. Watch cholesterol and blood lipids Have your blood tested for lipids and cholesterol at 54 years of age, then have this test every 5 years. You may need to have your cholesterol levels checked more often if: Your lipid or cholesterol levels are high. You are older than 54 years of age. You are at high risk for heart disease. What should I know about cancer screening? Many types of cancers can be detected early and may often be prevented. Depending on your health history and family history, you may need to have cancer screening at various ages. This may include screening for: Colorectal cancer. Prostate cancer. Skin cancer. Lung cancer. What should I know about heart disease, diabetes, and high blood pressure? Blood pressure and heart disease High blood pressure causes heart disease and increases the risk of stroke. This is more likely to develop in people who have high blood pressure readings or are overweight. Talk with your health care provider about your target blood pressure readings. Have your blood pressure checked: Every 3-5 years if you are 28-28 years of age. Every year if you are 13 years old or older. If you are between the ages of 64 and 67 and are a current  or former smoker, ask your health care provider if you should have a one-time screening for abdominal aortic aneurysm (AAA). Diabetes Have regular diabetes screenings. This checks your fasting blood sugar level. Have the screening done: Once every three years after age 80 if you are at  a normal weight and have a low risk for diabetes. More often and at a younger age if you are overweight or have a high risk for diabetes. What should I know about preventing infection? Hepatitis B If you have a higher risk for hepatitis B, you should be screened for this virus. Talk with your health care provider to find out if you are at risk for hepatitis B infection. Hepatitis C Blood testing is recommended for: Everyone born from [birth year] through 1966. Anyone with known risk factors for hepatitis C. Sexually transmitted infections (STIs) You should be screened each year for STIs, including gonorrhea and chlamydia, if: You are sexually active and are younger than 54 years of age. You are older than 54 years of age and your health care provider tells you that you are at risk for this type of infection. Your sexual activity has changed since you were last screened, and you are at increased risk for chlamydia or gonorrhea. Ask your health care provider if you are at risk. Ask your health care provider about whether you are at high risk for HIV. Your health care provider may recommend a prescription medicine to help prevent HIV infection. If you choose to take medicine to prevent HIV, you should first get tested for HIV. You should then be tested every 3 months for as long as you are taking the medicine. Follow these instructions at home: Alcohol use Do not drink alcohol if your health care provider tells you not to drink. If you drink alcohol: Limit how much you have to 0-2 drinks a day. Know how much alcohol is in your drink. In the [place] one drink equals one 12 oz bottle of beer (355 mL), one 5 oz glass of wine (148 mL), or one 1 oz glass of hard liquor (44 mL). Lifestyle Do not use any products that contain nicotine or tobacco. These products include cigarettes, chewing tobacco, and vaping devices, such as e-cigarettes. If you need help quitting, ask your health care provider. Do not use  street drugs. Do not share needles. Ask your health care provider for help if you need support or information about quitting drugs. General instructions Schedule regular health, dental, and eye exams. Stay current with your vaccines. Tell your health care provider if: You often feel depressed. You have ever been abused or do not feel safe at home. Summary Adopting a healthy lifestyle and getting preventive care are important in promoting health and wellness. Follow your health care provider's instructions about healthy diet, exercising, and getting tested or screened for diseases. Follow your health care provider's instructions on monitoring your cholesterol and blood pressure. This information is not intended to replace advice given to you by your health care provider. Make sure you discuss any questions you have with your health care provider. Document Revised: 03/29/2021 Document Reviewed: 03/29/2021 Elsevier Patient Education  2024 [employer].

## 2024-09-25 NOTE — Assessment & Plan Note (Addendum)
 BP elevated today, he did not take his antihypertensive medication today. Improved after a few minutes. Instructed to monitor BP daily at home and to let me know about readings in about 2 weeks. Continue lisinopril  20 mg daily and low-salt diet. Currently asymptomatic, I would like to have an EKG done but unfortunately our EKG machine is not at the building (being repaired) at this time and not sure when he will be brought back.  We could arrange a nurse visit for an EKG once the machine is available. Eye exam is current. Clearly instructed about warning signs. As far as BP is at goal, we can continue annual follow-ups.

## 2024-09-26 ENCOUNTER — Ambulatory Visit: Payer: Self-pay | Admitting: Family Medicine

## 2024-09-26 ENCOUNTER — Encounter: Payer: Self-pay | Admitting: Family Medicine

## 2024-09-26 LAB — TESTOSTERONE: Testosterone: 420.3 ng/dL (ref 300.00–890.00)

## 2024-09-26 LAB — TSH: TSH: 1.56 u[IU]/mL (ref 0.35–5.50)

## 2024-09-26 LAB — PSA: PSA: 0.55 ng/mL (ref 0.10–4.00)

## 2024-10-14 ENCOUNTER — Other Ambulatory Visit: Payer: Self-pay | Admitting: Family Medicine

## 2024-10-14 DIAGNOSIS — I10 Essential (primary) hypertension: Secondary | ICD-10-CM

## 2024-10-15 ENCOUNTER — Encounter: Payer: Self-pay | Admitting: Family Medicine

## 2024-10-25 ENCOUNTER — Other Ambulatory Visit: Payer: Self-pay | Admitting: Family Medicine

## 2024-10-25 DIAGNOSIS — I10 Essential (primary) hypertension: Secondary | ICD-10-CM

## 2024-10-25 MED ORDER — AMLODIPINE BESYLATE 5 MG PO TABS: 5.0000 mg | ORAL_TABLET | Freq: Every day | ORAL | 0 refills | Status: AC
